# Patient Record
Sex: Male | Born: 1950 | Race: White | Hispanic: No | Marital: Single | Smoking: Former smoker
Health system: Southern US, Community
[De-identification: ages and names within clinical notes are randomized; demographics above are authoritative.]

## PROBLEM LIST (undated history)

## (undated) DIAGNOSIS — E785 Hyperlipidemia, unspecified: Secondary | ICD-10-CM

## (undated) DIAGNOSIS — K5120 Ulcerative (chronic) proctitis without complications: Secondary | ICD-10-CM

## (undated) DIAGNOSIS — I4891 Unspecified atrial fibrillation: Secondary | ICD-10-CM

## (undated) HISTORY — DX: Unspecified atrial fibrillation: I48.91

## (undated) HISTORY — PX: TOTAL HIP ARTHROPLASTY: SHX124

## (undated) HISTORY — DX: Ulcerative (chronic) proctitis without complications: K51.20

## (undated) HISTORY — DX: Hyperlipidemia, unspecified: E78.5

## (undated) HISTORY — PX: ATRIAL FIBRILLATION ABLATION: EP1191

---

## 2016-02-20 DIAGNOSIS — E785 Hyperlipidemia, unspecified: Secondary | ICD-10-CM | POA: Diagnosis present

## 2016-02-20 DIAGNOSIS — M1611 Unilateral primary osteoarthritis, right hip: Secondary | ICD-10-CM | POA: Diagnosis not present

## 2016-02-20 DIAGNOSIS — D62 Acute posthemorrhagic anemia: Secondary | ICD-10-CM | POA: Diagnosis not present

## 2016-02-20 DIAGNOSIS — Z7982 Long term (current) use of aspirin: Secondary | ICD-10-CM | POA: Diagnosis not present

## 2016-02-20 DIAGNOSIS — K5190 Ulcerative colitis, unspecified, without complications: Secondary | ICD-10-CM | POA: Diagnosis present

## 2016-03-14 DIAGNOSIS — R600 Localized edema: Secondary | ICD-10-CM | POA: Diagnosis not present

## 2016-03-14 DIAGNOSIS — M79671 Pain in right foot: Secondary | ICD-10-CM | POA: Diagnosis not present

## 2016-04-11 DIAGNOSIS — Z96641 Presence of right artificial hip joint: Secondary | ICD-10-CM | POA: Diagnosis not present

## 2016-04-11 DIAGNOSIS — Z471 Aftercare following joint replacement surgery: Secondary | ICD-10-CM | POA: Diagnosis not present

## 2016-04-11 DIAGNOSIS — S76011A Strain of muscle, fascia and tendon of right hip, initial encounter: Secondary | ICD-10-CM | POA: Diagnosis not present

## 2016-07-21 DIAGNOSIS — Y92009 Unspecified place in unspecified non-institutional (private) residence as the place of occurrence of the external cause: Secondary | ICD-10-CM | POA: Diagnosis not present

## 2016-07-21 DIAGNOSIS — X58XXXA Exposure to other specified factors, initial encounter: Secondary | ICD-10-CM | POA: Diagnosis not present

## 2016-07-21 DIAGNOSIS — S91211A Laceration without foreign body of right great toe with damage to nail, initial encounter: Secondary | ICD-10-CM | POA: Diagnosis not present

## 2017-01-17 DIAGNOSIS — L301 Dyshidrosis [pompholyx]: Secondary | ICD-10-CM | POA: Diagnosis not present

## 2017-02-21 DIAGNOSIS — L301 Dyshidrosis [pompholyx]: Secondary | ICD-10-CM | POA: Diagnosis not present

## 2017-04-04 DIAGNOSIS — L2082 Flexural eczema: Secondary | ICD-10-CM | POA: Diagnosis not present

## 2017-04-16 DIAGNOSIS — K1120 Sialoadenitis, unspecified: Secondary | ICD-10-CM | POA: Diagnosis not present

## 2017-04-16 DIAGNOSIS — J029 Acute pharyngitis, unspecified: Secondary | ICD-10-CM | POA: Diagnosis not present

## 2017-04-23 DIAGNOSIS — J069 Acute upper respiratory infection, unspecified: Secondary | ICD-10-CM | POA: Diagnosis not present

## 2017-05-08 DIAGNOSIS — K5120 Ulcerative (chronic) proctitis without complications: Secondary | ICD-10-CM | POA: Diagnosis not present

## 2017-05-08 DIAGNOSIS — R002 Palpitations: Secondary | ICD-10-CM | POA: Diagnosis not present

## 2017-05-08 DIAGNOSIS — E785 Hyperlipidemia, unspecified: Secondary | ICD-10-CM | POA: Diagnosis not present

## 2017-05-21 DIAGNOSIS — E785 Hyperlipidemia, unspecified: Secondary | ICD-10-CM | POA: Diagnosis not present

## 2017-05-21 DIAGNOSIS — I480 Paroxysmal atrial fibrillation: Secondary | ICD-10-CM | POA: Diagnosis not present

## 2017-05-21 DIAGNOSIS — R002 Palpitations: Secondary | ICD-10-CM | POA: Diagnosis not present

## 2017-05-22 DIAGNOSIS — K5120 Ulcerative (chronic) proctitis without complications: Secondary | ICD-10-CM | POA: Diagnosis not present

## 2017-06-11 DIAGNOSIS — Z23 Encounter for immunization: Secondary | ICD-10-CM | POA: Diagnosis not present

## 2017-06-11 DIAGNOSIS — Z0000 Encounter for general adult medical examination without abnormal findings: Secondary | ICD-10-CM | POA: Diagnosis not present

## 2017-06-11 DIAGNOSIS — Z125 Encounter for screening for malignant neoplasm of prostate: Secondary | ICD-10-CM | POA: Diagnosis not present

## 2017-06-11 DIAGNOSIS — K5120 Ulcerative (chronic) proctitis without complications: Secondary | ICD-10-CM | POA: Diagnosis not present

## 2017-06-11 DIAGNOSIS — H6123 Impacted cerumen, bilateral: Secondary | ICD-10-CM | POA: Diagnosis not present

## 2017-06-11 DIAGNOSIS — E785 Hyperlipidemia, unspecified: Secondary | ICD-10-CM | POA: Diagnosis not present

## 2017-06-11 DIAGNOSIS — Z1159 Encounter for screening for other viral diseases: Secondary | ICD-10-CM | POA: Diagnosis not present

## 2017-07-29 DIAGNOSIS — N528 Other male erectile dysfunction: Secondary | ICD-10-CM | POA: Diagnosis not present

## 2017-08-08 DIAGNOSIS — K640 First degree hemorrhoids: Secondary | ICD-10-CM | POA: Diagnosis not present

## 2017-08-08 DIAGNOSIS — D126 Benign neoplasm of colon, unspecified: Secondary | ICD-10-CM | POA: Diagnosis not present

## 2017-08-08 DIAGNOSIS — K621 Rectal polyp: Secondary | ICD-10-CM | POA: Diagnosis not present

## 2017-08-08 DIAGNOSIS — K5190 Ulcerative colitis, unspecified, without complications: Secondary | ICD-10-CM | POA: Diagnosis not present

## 2017-08-15 DIAGNOSIS — D126 Benign neoplasm of colon, unspecified: Secondary | ICD-10-CM | POA: Diagnosis not present

## 2017-08-15 DIAGNOSIS — K5190 Ulcerative colitis, unspecified, without complications: Secondary | ICD-10-CM | POA: Diagnosis not present

## 2017-08-15 DIAGNOSIS — K621 Rectal polyp: Secondary | ICD-10-CM | POA: Diagnosis not present

## 2017-11-19 DIAGNOSIS — Z961 Presence of intraocular lens: Secondary | ICD-10-CM | POA: Diagnosis not present

## 2017-11-19 DIAGNOSIS — H11829 Conjunctivochalasis, unspecified eye: Secondary | ICD-10-CM | POA: Diagnosis not present

## 2017-11-19 DIAGNOSIS — H4311 Vitreous hemorrhage, right eye: Secondary | ICD-10-CM | POA: Diagnosis not present

## 2017-11-19 DIAGNOSIS — H43393 Other vitreous opacities, bilateral: Secondary | ICD-10-CM | POA: Diagnosis not present

## 2017-11-19 DIAGNOSIS — H33311 Horseshoe tear of retina without detachment, right eye: Secondary | ICD-10-CM | POA: Diagnosis not present

## 2017-12-19 DIAGNOSIS — L579 Skin changes due to chronic exposure to nonionizing radiation, unspecified: Secondary | ICD-10-CM | POA: Diagnosis not present

## 2017-12-19 DIAGNOSIS — L301 Dyshidrosis [pompholyx]: Secondary | ICD-10-CM | POA: Diagnosis not present

## 2017-12-19 DIAGNOSIS — L2082 Flexural eczema: Secondary | ICD-10-CM | POA: Diagnosis not present

## 2018-01-27 DIAGNOSIS — Z09 Encounter for follow-up examination after completed treatment for conditions other than malignant neoplasm: Secondary | ICD-10-CM | POA: Diagnosis not present

## 2018-01-27 DIAGNOSIS — H4311 Vitreous hemorrhage, right eye: Secondary | ICD-10-CM | POA: Diagnosis not present

## 2018-01-27 DIAGNOSIS — H33311 Horseshoe tear of retina without detachment, right eye: Secondary | ICD-10-CM | POA: Diagnosis not present

## 2018-03-20 DIAGNOSIS — L579 Skin changes due to chronic exposure to nonionizing radiation, unspecified: Secondary | ICD-10-CM | POA: Diagnosis not present

## 2018-03-20 DIAGNOSIS — L2082 Flexural eczema: Secondary | ICD-10-CM | POA: Diagnosis not present

## 2018-03-20 DIAGNOSIS — L814 Other melanin hyperpigmentation: Secondary | ICD-10-CM | POA: Diagnosis not present

## 2018-06-16 DIAGNOSIS — Z23 Encounter for immunization: Secondary | ICD-10-CM | POA: Diagnosis not present

## 2018-06-16 DIAGNOSIS — Z131 Encounter for screening for diabetes mellitus: Secondary | ICD-10-CM | POA: Diagnosis not present

## 2018-06-16 DIAGNOSIS — Z125 Encounter for screening for malignant neoplasm of prostate: Secondary | ICD-10-CM | POA: Diagnosis not present

## 2018-06-16 DIAGNOSIS — Z0000 Encounter for general adult medical examination without abnormal findings: Secondary | ICD-10-CM | POA: Diagnosis not present

## 2018-06-16 DIAGNOSIS — E785 Hyperlipidemia, unspecified: Secondary | ICD-10-CM | POA: Diagnosis not present

## 2018-06-16 DIAGNOSIS — I480 Paroxysmal atrial fibrillation: Secondary | ICD-10-CM | POA: Diagnosis not present

## 2018-09-12 DIAGNOSIS — L579 Skin changes due to chronic exposure to nonionizing radiation, unspecified: Secondary | ICD-10-CM | POA: Diagnosis not present

## 2018-09-12 DIAGNOSIS — L814 Other melanin hyperpigmentation: Secondary | ICD-10-CM | POA: Diagnosis not present

## 2018-09-12 DIAGNOSIS — L2082 Flexural eczema: Secondary | ICD-10-CM | POA: Diagnosis not present

## 2018-09-12 DIAGNOSIS — Z79899 Other long term (current) drug therapy: Secondary | ICD-10-CM | POA: Diagnosis not present

## 2018-09-12 DIAGNOSIS — L853 Xerosis cutis: Secondary | ICD-10-CM | POA: Diagnosis not present

## 2018-09-12 DIAGNOSIS — L301 Dyshidrosis [pompholyx]: Secondary | ICD-10-CM | POA: Diagnosis not present

## 2018-10-23 DIAGNOSIS — Z79899 Other long term (current) drug therapy: Secondary | ICD-10-CM | POA: Diagnosis not present

## 2018-10-23 DIAGNOSIS — L579 Skin changes due to chronic exposure to nonionizing radiation, unspecified: Secondary | ICD-10-CM | POA: Diagnosis not present

## 2018-10-23 DIAGNOSIS — L2082 Flexural eczema: Secondary | ICD-10-CM | POA: Diagnosis not present

## 2018-11-12 DIAGNOSIS — K5120 Ulcerative (chronic) proctitis without complications: Secondary | ICD-10-CM | POA: Diagnosis not present

## 2018-11-12 DIAGNOSIS — R197 Diarrhea, unspecified: Secondary | ICD-10-CM | POA: Diagnosis not present

## 2018-11-27 ENCOUNTER — Telehealth: Payer: Self-pay | Admitting: Internal Medicine

## 2018-11-27 NOTE — Telephone Encounter (Signed)
Records received from [facility] Appt 12/03/18 @ 10:20AM. [place]

## 2018-12-02 ENCOUNTER — Telehealth: Payer: Self-pay | Admitting: Internal Medicine

## 2018-12-02 NOTE — Telephone Encounter (Signed)
LVM for pre reg

## 2018-12-02 NOTE — Telephone Encounter (Signed)
Smartphone/ my chart via text/ virtual consent /pre reg completed

## 2018-12-03 ENCOUNTER — Telehealth: Payer: Self-pay

## 2018-12-03 ENCOUNTER — Telehealth: Payer: Self-pay | Admitting: Radiology

## 2018-12-03 ENCOUNTER — Encounter: Payer: Self-pay | Admitting: Internal Medicine

## 2018-12-03 ENCOUNTER — Telehealth (INDEPENDENT_AMBULATORY_CARE_PROVIDER_SITE_OTHER): Payer: Medicare Other | Admitting: Internal Medicine

## 2018-12-03 VITALS — Ht 72.0 in | Wt 191.0 lb

## 2018-12-03 DIAGNOSIS — R002 Palpitations: Secondary | ICD-10-CM

## 2018-12-03 DIAGNOSIS — R0602 Shortness of breath: Secondary | ICD-10-CM

## 2018-12-03 DIAGNOSIS — E785 Hyperlipidemia, unspecified: Secondary | ICD-10-CM

## 2018-12-03 DIAGNOSIS — I4891 Unspecified atrial fibrillation: Principal | ICD-10-CM

## 2018-12-03 DIAGNOSIS — I480 Paroxysmal atrial fibrillation: Secondary | ICD-10-CM

## 2018-12-03 NOTE — Patient Instructions (Addendum)
Medication Instructions:   STOP GEMFIBROZIL  If you need a refill on your cardiac medications before your next appointment, please call your pharmacy.   Lab work:  NONE ordered at this time of appointment   If you have labs (blood work) drawn today and your tests are completely normal, you will receive your results only by: Marland Kitchen MyChart Message (if you have MyChart) OR . A paper copy in the mail If you have any lab test that is abnormal or we need to change your treatment, we will call you to review the results.  Testing/Procedures: Your physician has recommended that you wear a 3 DAY ZIO-PATCH monitor. The Zio patch cardiac monitor continuously records heart rhythm data for up to 14 days, this is for patients being evaluated for multiple types heart rhythms. For the first 24 hours post application, please avoid getting the Zio monitor wet in the shower or by excessive sweating during exercise. After that, feel free to carry on with regular activities. Keep soaps and lotions away from the ZIO XT Patch.  This will be placed at our [facility] location - [address], [address].         Follow-Up:  At [facility], you and your health needs are our priority.  As part of our continuing mission to provide you with exceptional heart care, we have created designated Provider Care Teams.  These Care Teams include your primary Cardiologist (physician) and Advanced Practice Providers (APPs -  Physician Assistants and Nurse Practitioners) who all work together to provide you with the care you need, when you need it. You will need a follow up appointment in 1 months.  Please call our office 2 months in advance to schedule this appointment.  You may see Elouise Munroe, MD or one of the following Advanced Practice Providers on your designated Care Team:   Rosaria Ferries, PA-C . Jory Sims, DNP, ANP  Any Other Special Instructions Will Be Listed Below (If Applicable).

## 2018-12-03 NOTE — Telephone Encounter (Signed)
Virtual Visit Pre-Appointment Phone Call  "(Name), I am calling you today to discuss your upcoming appointment. We are currently trying to limit exposure to the virus that causes COVID-19 by seeing patients at home rather than in the office."  1. "What is the BEST phone number to call the day of the visit?" - include this in appointment notes  2. "Do you have or have access to (through a family member/friend) a smartphone with video capability that we can use for your visit?" a. If yes - list this number in appt notes as "cell" (if different from BEST phone #) and list the appointment type as a VIDEO visit in appointment notes b. If no - list the appointment type as a PHONE visit in appointment notes  3. Confirm consent - "In the setting of the current Covid19 crisis, you are scheduled for a VIDEO visit with your provider on 12/03/2018 at 10:20AM.  Just as we do with many in-office visits, in order for you to participate in this visit, we must obtain consent.  If you'd like, I can send this to your mychart (if signed up) or email for you to review.  Otherwise, I can obtain your verbal consent now.  All virtual visits are billed to your insurance company just like a normal visit would be.  By agreeing to a virtual visit, we'd like you to understand that the technology does not allow for your provider to perform an examination, and thus may limit your provider's ability to fully assess your condition. If your provider identifies any concerns that need to be evaluated in person, we will make arrangements to do so.  Finally, though the technology is pretty good, we cannot assure that it will always work on either your or our end, and in the setting of a video visit, we may have to convert it to a phone-only visit.  In either situation, we cannot ensure that we have a secure connection.  Are you willing to proceed?" STAFF: Did the patient verbally acknowledge consent to telehealth visit? Document YES/NO  here: YES  4. Advise patient to be prepared - "Two hours prior to your appointment, go ahead and check your blood pressure, pulse, oxygen saturation, and your weight (if you have the equipment to check those) and write them all down. When your visit starts, your provider will ask you for this information. If you have an Apple Watch or Kardia device, please plan to have heart rate information ready on the day of your appointment. Please have a pen and paper handy nearby the day of the visit as well."  5. Give patient instructions for MyChart download to smartphone OR Doximity/Doxy.me as below if video visit (depending on what platform provider is using)  6. Inform patient they will receive a phone call 15 minutes prior to their appointment time (may be from unknown caller ID) so they should be prepared to answer    TELEPHONE CALL NOTE  Kent Bryan has been deemed a candidate for a follow-up tele-health visit to limit community exposure during the Covid-19 pandemic. I spoke with the patient via phone to ensure availability of phone/video source, confirm preferred email & phone number, and discuss instructions and expectations.  I reminded Kent Bryan to be prepared with any vital sign and/or heart rhythm information that could potentially be obtained via home monitoring, at the time of his visit. I reminded Kent Bryan to expect a phone call prior to his visit.  Jacqulynn Cadet,  CMA 12/03/2018 10:40 AM   INSTRUCTIONS FOR DOWNLOADING THE MYCHART APP TO SMARTPHONE  - The patient must first make sure to have activated MyChart and know their login information - If Apple, go to [employer] and type in MyChart in the search bar and download the app. If Android, ask patient to go to [employer] and type in [place]ola in the search bar and download the app. The app is free but as with any other app downloads, their phone may require them to verify saved payment information or Apple/Android password.  -  The patient will need to then log into the app with their MyChart username and password, and select [facility] as their healthcare provider to link the account. When it is time for your visit, go to the MyChart app, find appointments, and click Begin Video Visit. Be sure to Select Allow for your device to access the Microphone and Camera for your visit. You will then be connected, and your provider will be with you shortly.  **If they have any issues connecting, or need assistance please contact MyChart service desk [phone])**  **If using a computer, in order to ensure the best quality for their visit they will need to use either of the following Internet Browsers: [employer], or Google Chrome**  IF USING DOXIMITY or DOXY.ME - The patient will receive a link just prior to their visit by text.     FULL LENGTH CONSENT FOR TELE-HEALTH VISIT   I hereby voluntarily request, consent and authorize [facility] and its employed or contracted physicians, physician assistants, nurse practitioners or other licensed health care professionals (the Practitioner), to provide me with telemedicine health care services (the "Services") as deemed necessary by the treating Practitioner. I acknowledge and consent to receive the Services by the Practitioner via telemedicine. I understand that the telemedicine visit will involve communicating with the Practitioner through live audiovisual communication technology and the disclosure of certain medical information by electronic transmission. I acknowledge that I have been given the opportunity to request an in-person assessment or other available alternative prior to the telemedicine visit and am voluntarily participating in the telemedicine visit.  I understand that I have the right to withhold or withdraw my consent to the use of telemedicine in the course of my care at any time, without affecting my right to future care or treatment, and that the  Practitioner or I may terminate the telemedicine visit at any time. I understand that I have the right to inspect all information obtained and/or recorded in the course of the telemedicine visit and may receive copies of available information for a reasonable fee.  I understand that some of the potential risks of receiving the Services via telemedicine include:  Marland Kitchen Delay or interruption in medical evaluation due to technological equipment failure or disruption; . Information transmitted may not be sufficient (e.g. poor resolution of images) to allow for appropriate medical decision making by the Practitioner; and/or  . In rare instances, security protocols could fail, causing a breach of personal health information.  Furthermore, I acknowledge that it is my responsibility to provide information about my medical history, conditions and care that is complete and accurate to the best of my ability. I acknowledge that Practitioner's advice, recommendations, and/or decision may be based on factors not within their control, such as incomplete or inaccurate data provided by me or distortions of diagnostic images or specimens that may result from electronic transmissions. I understand that the practice of  medicine is not an Chief Strategy Officer and that Practitioner makes no warranties or guarantees regarding treatment outcomes. I acknowledge that I will receive a copy of this consent concurrently upon execution via email to the email address I last provided but may also request a printed copy by calling the office of [facility].    I understand that my insurance will be billed for this visit.   I have read or had this consent read to me. . I understand the contents of this consent, which adequately explains the benefits and risks of the Services being provided via telemedicine.  . I have been provided ample opportunity to ask questions regarding this consent and the Services and have had my questions answered to my  satisfaction. . I give my informed consent for the services to be provided through the use of telemedicine in my medical care  By participating in this telemedicine visit I agree to the above.

## 2018-12-03 NOTE — Progress Notes (Signed)
Virtual Visit via Video Note   This visit type was conducted due to national recommendations for restrictions regarding the COVID-19 Pandemic (e.g. social distancing) in an effort to limit this patient's exposure and mitigate transmission in our community.  Due to his co-morbid illnesses, this patient is at least at moderate risk for complications without adequate follow up.  This format is felt to be most appropriate for this patient at this time.  All issues noted in this document were discussed and addressed.  A limited physical exam was performed with this format.  Please refer to the patient's chart for his consent to telehealth for [facility].   Evaluation Performed:  Follow-up visit  Date:  12/03/2018   ID:  Kent Bryan, DOB 1950/11/13, MRN [id number]  Patient Location: Home Provider Location: Home  PCP:  Kristen Loader, FNP  Cardiologist:  Elouise Munroe, MD  Electrophysiologist:  None   Chief Complaint:  Follow up atrial fibrillation  History of Present Illness:    Kent Bryan is a 68 y.o. male with paroxysmal atrial fibrillation and hyperlipidemia who presents today for follow up of afib.   He was previously seen by Dr. Tollie Eth on May 21, 2017. Patient notes that in 2004 he was diagnosed with afib, and in 2006-2007, he had 3 ablations with recurrence of atrial fibrillation. He was treated at the [facility] by Dr. Melene Muller. He try AAD but was unable to maintain sinus rhythm.   He visited the [facility] in 2014 and had an additional ablation. He has been maintaining sinus rhythm since that time. He took anticoagulation at the time of his ablation, but has not since.   However, 2-3 week ago he began to notice a sense of 2-3 second bursts of a fluttering feeling. It is fleeting but will occur throughout the day. He is concerned he may be back in atrial fibrillation.   He notes his symptoms correlate as follows: when in afib, he feels a fast  irregular heart rhythm, and when he feels he is in atrial flutter, he feels short of breath but a normal heart rate. He has a sense of shortness of breath now, primarily with activity.  The patient denies chest pain, chest pressure, PND, orthopnea, or leg swelling. Denies syncope or presyncope. Denies dizziness or lightheadedness.   The patient does not have symptoms concerning for COVID-19 infection (fever, chills, cough, or new shortness of breath).    Past Medical History:  Diagnosis Date  . Atrial fibrillation ([place])   . Hyperlipidemia   . Ulcerative proctitis [facility])    Past Surgical History:  Procedure Laterality Date  . ATRIAL FIBRILLATION ABLATION    . TOTAL HIP ARTHROPLASTY Bilateral      Current Meds  Medication Sig  . B Complex Vitamins (B COMPLEX 1 PO) B Complex  1 qd  . folic acid (FOLVITE) 1 MG tablet Take 1 mg by mouth daily.  . mesalamine (LIALDA) 1.2 g EC tablet Take 1.2 g by mouth 2 (two) times a day.   . methotrexate 2.5 MG tablet Take 12.5 mg by mouth once a week. Caution: Chemotherapy. Protect from light.   . Multiple Vitamin (MULTI-VITAMIN DAILY PO) Take 1 tablet by mouth 2 (two) times a day.   . simvastatin (ZOCOR) 20 MG tablet Take 20 mg by mouth daily at 6 PM.   . [DISCONTINUED] gemfibrozil (LOPID) 600 MG tablet Take 600 mg by mouth 2 (two) times a day.  Allergies:   Patient has no known allergies.   Social History   Tobacco Use  . Smoking status: Former Research scientist (life sciences)  . Smokeless tobacco: Never Used  Substance Use Topics  . Alcohol use: Not on file  . Drug use: Not on file     Family Hx: The patient's family history includes Heart failure in his mother. No family history of sudden death.  ROS:   Please see the history of present illness.     All other systems reviewed and are negative.   Prior CV studies:   The following studies were reviewed today:    Labs/Other Tests and Data Reviewed:    EKG:  No ECG reviewed.  Recent Labs: No  results found for requested labs within last 8760 hours.   Recent Lipid Panel No results found for: CHOL, TRIG, HDL, CHOLHDL, LDLCALC, LDLDIRECT  Wt Readings from Last 3 Encounters:  12/26/18 190 lb (86.2 kg)  12/03/18 191 lb (86.6 kg)     Objective:    Vital Signs:  Ht 6' (1.829 m)   Wt 191 lb (86.6 kg)   BMI 25.90 kg/m    VITAL SIGNS:  reviewed GEN:  no acute distress EYES:  sclerae anicteric, EOMI - Extraocular Movements Intact RESPIRATORY:  normal respiratory effort, symmetric expansion CARDIOVASCULAR:  no peripheral edema SKIN:  no rash, lesions or ulcers. MUSCULOSKELETAL:  no obvious deformities. NEURO:  alert and oriented x 3, no obvious focal deficit PSYCH:  normal affect  ASSESSMENT & PLAN:    1. Palpitations   2. Paroxysmal atrial fibrillation (HCC)   3. Hyperlipidemia, unspecified hyperlipidemia type   4. Shortness of breath    Palpitations/PAF - we will screen with a Zio patch to ensure no recurrence of Afib.   Hyperlipidemia- controlled per patient, discontinued fibrate in setting of current statin use, will reassess lipids in follow up.   Shortness of breath - will monitor, may be related to rhythm/afib. Will reassess if no afib, consider possibility of pulmonary vein stenosis with hx of repeated ablations.  COVID-19 Education: The signs and symptoms of COVID-19 were discussed with the patient and how to seek care for testing (follow up with PCP or arrange E-visit).  The importance of social distancing was discussed today.  Time:   Today, I have spent 35 minutes with the patient with telehealth technology discussing the above problems.     Medication Adjustments/Labs and Tests Ordered: Current medicines are reviewed at length with the patient today.  Concerns regarding medicines are outlined above.   Tests Ordered: Orders Placed This Encounter  Procedures  . LONG TERM MONITOR (3-14 DAYS)    Medication Changes: No orders of the defined types were  placed in this encounter.   Disposition:  Follow up in 1 month(s)  Signed, Elouise Munroe, MD  12/03/2018 10:58 AM    [facility]  Medication Instructions:   STOP GEMFIBROZIL  If you need a refill on your cardiac medications before your next appointment, please call your pharmacy.   Lab work:  NONE ordered at this time of appointment   If you have labs (blood work) drawn today and your tests are completely normal, you will receive your results only by: Marland Kitchen MyChart Message (if you have MyChart) OR . A paper copy in the mail If you have any lab test that is abnormal or we need to change your treatment, we will call you to review the results.  Testing/Procedures: Your physician has recommended that  you wear a 3 DAY ZIO-PATCH monitor. The Zio patch cardiac monitor continuously records heart rhythm data for up to 14 days, this is for patients being evaluated for multiple types heart rhythms. For the first 24 hours post application, please avoid getting the Zio monitor wet in the shower or by excessive sweating during exercise. After that, feel free to carry on with regular activities. Keep soaps and lotions away from the ZIO XT Patch.  This will be placed at our [facility] location - [address], [address].         Follow-Up:  At [facility], you and your health needs are our priority.  As part of our continuing mission to provide you with exceptional heart care, we have created designated Provider Care Teams.  These Care Teams include your primary Cardiologist (physician) and Advanced Practice Providers (APPs -  Physician Assistants and Nurse Practitioners) who all work together to provide you with the care you need, when you need it. You will need a follow up appointment in 1 months.  Please call our office 2 months in advance to schedule this appointment.  You may see Elouise Munroe, MD or one of the following Advanced Practice Providers on your  designated Care Team:   Rosaria Ferries, PA-C . Jory Sims, DNP, ANP  Any Other Special Instructions Will Be Listed Below (If Applicable).

## 2018-12-03 NOTE — Telephone Encounter (Signed)
Enrolled patient for a 3 Day Zio Long Term Monitor to be mailed due to Covid-19. I went over instructions with patients and he knows to expect the monitor to arrive in 3-4 days.

## 2018-12-08 ENCOUNTER — Ambulatory Visit (INDEPENDENT_AMBULATORY_CARE_PROVIDER_SITE_OTHER): Payer: Medicare Other

## 2018-12-08 DIAGNOSIS — I4891 Unspecified atrial fibrillation: Secondary | ICD-10-CM | POA: Diagnosis not present

## 2018-12-18 ENCOUNTER — Other Ambulatory Visit: Payer: Self-pay

## 2018-12-18 DIAGNOSIS — I4891 Unspecified atrial fibrillation: Secondary | ICD-10-CM | POA: Diagnosis not present

## 2018-12-22 DIAGNOSIS — L579 Skin changes due to chronic exposure to nonionizing radiation, unspecified: Secondary | ICD-10-CM | POA: Diagnosis not present

## 2018-12-22 DIAGNOSIS — L2082 Flexural eczema: Secondary | ICD-10-CM | POA: Diagnosis not present

## 2018-12-22 DIAGNOSIS — L814 Other melanin hyperpigmentation: Secondary | ICD-10-CM | POA: Diagnosis not present

## 2018-12-24 ENCOUNTER — Telehealth: Payer: Self-pay | Admitting: Internal Medicine

## 2018-12-25 NOTE — Telephone Encounter (Signed)
VERBAL CONSENT WAS GIVEN TO Kent Bryan ON 12/24/2018.      Virtual Visit Pre-Appointment Phone Call  "Kent Bryan, I am calling you today to discuss your upcoming appointment. We are currently trying to limit exposure to the virus that causes COVID-19 by seeing patients at home rather than in the office."  1. "What is the BEST phone number to call the day of the visit?" - include this in appointment notes  2. "Do you have or have access to (through a family member/friend) a smartphone with video capability that we can use for your visit?" a. If yes - list this number in appt notes as "cell" (if different from BEST phone #) and list the appointment type as a VIDEO visit in appointment notes b. If no - list the appointment type as a PHONE visit in appointment notes  3. Confirm consent - "In the setting of the current Covid19 crisis, you are scheduled for a VIDEO visit with your provider on 12/26/2018 at 11:20am.  Just as we do with many in-office visits, in order for you to participate in this visit, we must obtain consent.  If you'd like, I can send this to your mychart (if signed up) or email for you to review.  Otherwise, I can obtain your verbal consent now.  All virtual visits are billed to your insurance company just like a normal visit would be.  By agreeing to a virtual visit, we'd like you to understand that the technology does not allow for your provider to perform an examination, and thus may limit your provider's ability to fully assess your condition. If your provider identifies any concerns that need to be evaluated in person, we will make arrangements to do so.  Finally, though the technology is pretty good, we cannot assure that it will always work on either your or our end, and in the setting of a video visit, we may have to convert it to a phone-only visit.  In either situation, we cannot ensure that we have a secure connection.  Are you willing to proceed?" STAFF: Did the patient  verbally acknowledge consent to telehealth visit? Document YES/NO here: YES  4. Advise patient to be prepared - "Two hours prior to your appointment, go ahead and check your blood pressure, pulse, oxygen saturation, and your weight (if you have the equipment to check those) and write them all down. When your visit starts, your provider will ask you for this information. If you have an Apple Watch or Kardia device, please plan to have heart rate information ready on the day of your appointment. Please have a pen and paper handy nearby the day of the visit as well."  5. Give patient instructions for MyChart download to smartphone OR Doximity/Doxy.me as below if video visit (depending on what platform provider is using)  6. Inform patient they will receive a phone call 15 minutes prior to their appointment time (may be from unknown caller ID) so they should be prepared to answer    TELEPHONE CALL NOTE  Kent Bryan has been deemed a candidate for a follow-up tele-health visit to limit community exposure during the Covid-19 pandemic. I spoke with the patient via phone to ensure availability of phone/video source, confirm preferred email & phone number, and discuss instructions and expectations.  I reminded Kent Bryan to be prepared with any vital sign and/or heart rhythm information that could potentially be obtained via home monitoring, at the time of his visit. I reminded Kent Bryan to expect a phone call prior to his visit.  Kent Bryan, [place] 12/25/2018 5:48 PM   INSTRUCTIONS FOR DOWNLOADING THE MYCHART APP TO SMARTPHONE  - The patient must first make sure to have activated MyChart and know their login information - If Apple, go to [employer] and type in MyChart in the search bar and download the app. If Android, ask patient to go to [employer] and type in Olivia in the search bar and download the app. The app is free but as with any other app downloads, their phone may require them  to verify saved payment information or Apple/Android password.  - The patient will need to then log into the app with their MyChart username and password, and select [facility] as their healthcare provider to link the account. When it is time for your visit, go to the MyChart app, find appointments, and click Begin Video Visit. Be sure to Select Allow for your device to access the Microphone and Camera for your visit. You will then be connected, and your provider will be with you shortly.  **If they have any issues connecting, or need assistance please contact MyChart service desk [phone])**  **If using a computer, in order to ensure the best quality for their visit they will need to use either of the following Internet Browsers: [employer], or Google Chrome**  IF USING DOXIMITY or DOXY.ME - The patient will receive a link just prior to their visit by text.     FULL LENGTH CONSENT FOR TELE-HEALTH VISIT   I hereby voluntarily request, consent and authorize [facility] and its employed or contracted physicians, physician assistants, nurse practitioners or other licensed health care professionals (the Practitioner), to provide me with telemedicine health care services (the "Services") as deemed necessary by the treating Practitioner. I acknowledge and consent to receive the Services by the Practitioner via telemedicine. I understand that the telemedicine visit will involve communicating with the Practitioner through live audiovisual communication technology and the disclosure of certain medical information by electronic transmission. I acknowledge that I have been given the opportunity to request an in-person assessment or other available alternative prior to the telemedicine visit and am voluntarily participating in the telemedicine visit.  I understand that I have the right to withhold or withdraw my consent to the use of telemedicine in the course of my care at any time,  without affecting my right to future care or treatment, and that the Practitioner or I may terminate the telemedicine visit at any time. I understand that I have the right to inspect all information obtained and/or recorded in the course of the telemedicine visit and may receive copies of available information for a reasonable fee.  I understand that some of the potential risks of receiving the Services via telemedicine include:  Marland Kitchen Delay or interruption in medical evaluation due to technological equipment failure or disruption; . Information transmitted may not be sufficient (e.g. poor resolution of images) to allow for appropriate medical decision making by the Practitioner; and/or  . In rare instances, security protocols could fail, causing a breach of personal health information.  Furthermore, I acknowledge that it is my responsibility to provide information about my medical history, conditions and care that is complete and accurate to the best of my ability. I acknowledge that Practitioner's advice, recommendations, and/or decision may be based on factors not within their control, such as incomplete or inaccurate data provided by me or distortions of diagnostic images  or specimens that may result from electronic transmissions. I understand that the practice of medicine is not an exact science and that Practitioner makes no warranties or guarantees regarding treatment outcomes. I acknowledge that I will receive a copy of this consent concurrently upon execution via email to the email address I last provided but may also request a printed copy by calling the office of [facility].    I understand that my insurance will be billed for this visit.   I have read or had this consent read to me. . I understand the contents of this consent, which adequately explains the benefits and risks of the Services being provided via telemedicine.  . I have been provided ample opportunity to ask questions regarding  this consent and the Services and have had my questions answered to my satisfaction. . I give my informed consent for the services to be provided through the use of telemedicine in my medical care  By participating in this telemedicine visit I agree to the above.

## 2018-12-26 ENCOUNTER — Encounter: Payer: Self-pay | Admitting: Internal Medicine

## 2018-12-26 ENCOUNTER — Telehealth (INDEPENDENT_AMBULATORY_CARE_PROVIDER_SITE_OTHER): Payer: Medicare Other | Admitting: Internal Medicine

## 2018-12-26 ENCOUNTER — Telehealth: Payer: Self-pay

## 2018-12-26 VITALS — Ht 73.0 in | Wt 190.0 lb

## 2018-12-26 DIAGNOSIS — R0602 Shortness of breath: Secondary | ICD-10-CM | POA: Diagnosis not present

## 2018-12-26 DIAGNOSIS — E785 Hyperlipidemia, unspecified: Secondary | ICD-10-CM

## 2018-12-26 DIAGNOSIS — I480 Paroxysmal atrial fibrillation: Secondary | ICD-10-CM

## 2018-12-26 DIAGNOSIS — R002 Palpitations: Secondary | ICD-10-CM

## 2018-12-26 NOTE — Progress Notes (Signed)
Virtual Visit via Video Note   This visit type was conducted due to national recommendations for restrictions regarding the COVID-19 Pandemic (e.g. social distancing) in an effort to limit this patient's exposure and mitigate transmission in our community.  Due to his co-morbid illnesses, this patient is at least at moderate risk for complications without adequate follow up.  This format is felt to be most appropriate for this patient at this time.  All issues noted in this document were discussed and addressed.  A limited physical exam was performed with this format.  Please refer to the patient's chart for his consent to telehealth for [facility].   Date:  12/26/2018   ID:  Kent Bryan, DOB 18-Oct-1950, MRN [id number]  Patient Location: Home Provider Location: Home  PCP:  Kristen Loader, FNP  Cardiologist:  Elouise Munroe, MD  Electrophysiologist:  None   Evaluation Performed:  Follow-Up Visit  Chief Complaint:  F/u palpitations  History of Present Illness:    Kent Bryan is a 68 y.o. male with paroxysmal atrial fibrillation and hyperlipidemia who presents today for follow up of palpitations and shortness of breath.  We reviewed results of Zio patch, which demonstrated episodes of brief SVT without evidence of afib or atrial flutter. He feels his palpitations have improved, and are less frequent. He does get a sense of a "double tap" in a his chest that will occur for less than an hour, that will now happen once every 3 days. He does remain short of breath, and is primarily concerned about this. No recent cross sectional imaging available. No previous known diagnosis of pulmonary vein stenosis.   The patient denies chest pain, chest pressure, PND, orthopnea, or leg swelling. Denies syncope or presyncope. Denies dizziness or lightheadedness. Denies snoring and has not been evaluated for sleep apnea.  The patient does not have symptoms concerning for COVID-19 infection (fever,  chills, cough, or new shortness of breath).    Past Medical History:  Diagnosis Date  . Atrial fibrillation ([place])   . Hyperlipidemia   . Ulcerative proctitis [facility])    Past Surgical History:  Procedure Laterality Date  . ATRIAL FIBRILLATION ABLATION    . TOTAL HIP ARTHROPLASTY Bilateral      Current Meds  Medication Sig  . B Complex Vitamins (B COMPLEX 1 PO) B Complex  1 qd  . folic acid (FOLVITE) 1 MG tablet Take 1 mg by mouth daily.  . mesalamine (LIALDA) 1.2 g EC tablet Take 1.2 g by mouth 2 (two) times a day.   . methotrexate 2.5 MG tablet Take 12.5 mg by mouth once a week. Caution: Chemotherapy. Protect from light.   . Multiple Vitamin (MULTI-VITAMIN DAILY PO) Take 1 tablet by mouth 2 (two) times a day.   . simvastatin (ZOCOR) 20 MG tablet Take 20 mg by mouth daily at 6 PM.      Allergies:   Patient has no known allergies.   Social History   Tobacco Use  . Smoking status: Former Research scientist (life sciences)  . Smokeless tobacco: Never Used  Substance Use Topics  . Alcohol use: Not on file  . Drug use: Not on file     Family Hx: The patient's family history includes Heart failure in his mother.  ROS:   Please see the history of present illness.     All other systems reviewed and are negative.   Prior CV studies:   The following studies were reviewed today:  Zio results  Labs/Other Tests  and Data Reviewed:    EKG:  Zio monitor results reviewed demonstrating SVT  Recent Labs: No results found for requested labs within last 8760 hours.   Recent Lipid Panel No results found for: CHOL, TRIG, HDL, CHOLHDL, LDLCALC, LDLDIRECT  Wt Readings from Last 3 Encounters:  12/26/18 190 lb (86.2 kg)  12/03/18 191 lb (86.6 kg)     Objective:    Vital Signs:  Ht 6' 1"  (1.854 m)   Wt 190 lb (86.2 kg)   BMI 25.07 kg/m    VITAL SIGNS:  reviewed GEN:  no acute distress EYES:  sclerae anicteric, EOMI - Extraocular Movements Intact RESPIRATORY:  normal respiratory effort, symmetric  expansion CARDIOVASCULAR:  no peripheral edema SKIN:  no rash, lesions or ulcers. MUSCULOSKELETAL:  no obvious deformities. NEURO:  alert and oriented x 3, no obvious focal deficit PSYCH:  normal affect  ASSESSMENT & PLAN:    1. Shortness of breath   2. Paroxysmal atrial fibrillation (HCC)   3. Hyperlipidemia, unspecified hyperlipidemia type   4. Palpitations    For shortness of breath - we will obtain an echocardiogram to evaluate diastolic function, and if possible interrogation of pulmonary vein for pulmonary vein stenosis. *Addendum - I have also contacted the patient to schedule a CTA pulmonary vein study to evaluate for pulmonary vein stenosis.   Palpitations - likely represent SVT. I have offered hte patient an extended 30 day cardiac monitor if symptoms worsen or persist in a worrisome manner.  COVID-19 Education: The signs and symptoms of COVID-19 were discussed with the patient and how to seek care for testing (follow up with PCP or arrange E-visit).  The importance of social distancing was discussed today.  Time:   Today, I have spent 16 minutes with the patient with telehealth technology discussing the above problems.     Medication Adjustments/Labs and Tests Ordered: Current medicines are reviewed at length with the patient today.  Concerns regarding medicines are outlined above.   Tests Ordered: Orders Placed This Encounter  Procedures  . ECHOCARDIOGRAM COMPLETE    Medication Changes: No orders of the defined types were placed in this encounter.   Disposition:  Follow up in 1 month(s)  Signed, Elouise Munroe, MD  12/26/2018 11:09 AM     [facility]  Medication Instructions:  Your physician recommends that you continue on your current medications as directed. Please refer to the Current Medication list given to you today. If you need a refill on your cardiac medications before your next appointment, please call your pharmacy.    Lab work: NONE  If you have labs (blood work) drawn today and your tests are completely normal, you will receive your results only by: Marland Kitchen MyChart Message (if you have MyChart) OR . A paper copy in the mail If you have any lab test that is abnormal or we need to change your treatment, we will call you to review the results.  Testing/Procedures: Your physician has requested that you have an echocardiogram. Echocardiography is a painless test that uses sound waves to create images of your heart. It provides your doctor with information about the size and shape of your heart and how well your heart's chambers and valves are working. This procedure takes approximately one hour. There are no restrictions for this procedure. THIS TEST WILL BE COMPLETED AT AT [place] [address]  Follow-Up: At [facility], you and your health needs are our priority.  As part of our  continuing mission to provide you with exceptional heart care, we have created designated Provider Care Teams.  These Care Teams include your primary Cardiologist (physician) and Advanced Practice Providers (APPs -  Physician Assistants and Nurse Practitioners) who all work together to provide you with the care you need, when you need it. You will need a follow up appointment in 1 months.  You may see Elouise Munroe, MD or one of the following Advanced Practice Providers on your designated Care Team:   Rosaria Ferries, PA-C . Jory Sims, DNP, ANP  Any Other Special Instructions Will Be Listed Below (If Applicable).

## 2018-12-26 NOTE — Telephone Encounter (Signed)
Left message for patient to contact office to get virtual visit started.

## 2018-12-26 NOTE — Patient Instructions (Addendum)
Medication Instructions:  Your physician recommends that you continue on your current medications as directed. Please refer to the Current Medication list given to you today. If you need a refill on your cardiac medications before your next appointment, please call your pharmacy.   Lab work: NONE  If you have labs (blood work) drawn today and your tests are completely normal, you will receive your results only by: Marland Kitchen MyChart Message (if you have MyChart) OR . A paper copy in the mail If you have any lab test that is abnormal or we need to change your treatment, we will call you to review the results.  Testing/Procedures: Your physician has requested that you have an echocardiogram. Echocardiography is a painless test that uses sound waves to create images of your heart. It provides your doctor with information about the size and shape of your heart and how well your heart's chambers and valves are working. This procedure takes approximately one hour. There are no restrictions for this procedure. THIS TEST WILL BE COMPLETED AT AT [place] [address]  Follow-Up: At [facility], you and your health needs are our priority.  As part of our continuing mission to provide you with exceptional heart care, we have created designated Provider Care Teams.  These Care Teams include your primary Cardiologist (physician) and Advanced Practice Providers (APPs -  Physician Assistants and Nurse Practitioners) who all work together to provide you with the care you need, when you need it. You will need a follow up appointment in 1 months.  You may see Elouise Munroe, MD or one of the following Advanced Practice Providers on your designated Care Team:   Rosaria Ferries, PA-C . Jory Sims, DNP, ANP  Any Other Special Instructions Will Be Listed Below (If Applicable).

## 2018-12-26 NOTE — Telephone Encounter (Signed)
Follow up   Patient is ready to start virtual visit. Please call.

## 2018-12-26 NOTE — Telephone Encounter (Signed)
Left detailed message on patients machine informing patient of Dr Delphina Cahill recommendations (testing and follow up appt). Advised patient to contact the office with any questions or concerns.

## 2018-12-31 DIAGNOSIS — I480 Paroxysmal atrial fibrillation: Secondary | ICD-10-CM

## 2018-12-31 DIAGNOSIS — Q211 Atrial septal defect, unspecified: Secondary | ICD-10-CM

## 2018-12-31 DIAGNOSIS — R0602 Shortness of breath: Secondary | ICD-10-CM

## 2019-01-01 NOTE — Telephone Encounter (Signed)
Spoke with patient about AVS instructions. Patient voiced understanding and agreeable with Dr Delphina Cahill recommendations.

## 2019-01-02 NOTE — Telephone Encounter (Signed)
ORDERED PLACED FOR  CT OF PULM VEIN W AND W/O CONTRAST

## 2019-01-04 DIAGNOSIS — R072 Precordial pain: Secondary | ICD-10-CM | POA: Diagnosis not present

## 2019-01-04 DIAGNOSIS — Z79899 Other long term (current) drug therapy: Secondary | ICD-10-CM | POA: Diagnosis not present

## 2019-01-04 DIAGNOSIS — Z9889 Other specified postprocedural states: Secondary | ICD-10-CM | POA: Diagnosis not present

## 2019-01-04 DIAGNOSIS — R0602 Shortness of breath: Secondary | ICD-10-CM | POA: Diagnosis not present

## 2019-01-04 DIAGNOSIS — I2510 Atherosclerotic heart disease of native coronary artery without angina pectoris: Secondary | ICD-10-CM | POA: Diagnosis not present

## 2019-01-04 DIAGNOSIS — I4891 Unspecified atrial fibrillation: Secondary | ICD-10-CM | POA: Diagnosis not present

## 2019-01-05 DIAGNOSIS — R079 Chest pain, unspecified: Secondary | ICD-10-CM | POA: Diagnosis not present

## 2019-01-05 DIAGNOSIS — R0602 Shortness of breath: Secondary | ICD-10-CM | POA: Diagnosis not present

## 2019-01-05 DIAGNOSIS — R072 Precordial pain: Secondary | ICD-10-CM | POA: Diagnosis not present

## 2019-01-06 ENCOUNTER — Telehealth: Payer: Medicare Other | Admitting: Internal Medicine

## 2019-01-06 ENCOUNTER — Telehealth: Payer: Self-pay | Admitting: *Deleted

## 2019-01-06 NOTE — Telephone Encounter (Signed)
INFORMED PATIENT ECHO HAS BEEN CANCELLED. INFORMATION CAN BE LOCATED - CARE EVERYWHERE. PATIENT VERBALIZED UNDERSTANDING.

## 2019-01-06 NOTE — Telephone Encounter (Signed)
-----   Message from Elouise Munroe, MD sent at 01/06/2019  9:49 AM EDT ----- Regarding: cancel echo Please cancel patient's echo on 5/28, he just had one at [facility] yesterday.  Thanks, GA

## 2019-01-08 ENCOUNTER — Other Ambulatory Visit (HOSPITAL_COMMUNITY): Payer: Medicare Other

## 2019-01-23 ENCOUNTER — Telehealth (HOSPITAL_COMMUNITY): Payer: Self-pay | Admitting: Emergency Medicine

## 2019-01-23 ENCOUNTER — Other Ambulatory Visit (HOSPITAL_COMMUNITY): Payer: Self-pay | Admitting: Emergency Medicine

## 2019-01-23 DIAGNOSIS — I480 Paroxysmal atrial fibrillation: Secondary | ICD-10-CM

## 2019-01-23 NOTE — Telephone Encounter (Signed)
Left message on voicemail with name and callback number Kyelle Urbas RN Navigator Cardiac Imaging [facility] [phone] Office [phone] Cell  

## 2019-01-26 ENCOUNTER — Other Ambulatory Visit: Payer: Self-pay

## 2019-01-26 ENCOUNTER — Ambulatory Visit (HOSPITAL_COMMUNITY): Payer: Medicare Other

## 2019-01-26 ENCOUNTER — Ambulatory Visit (HOSPITAL_COMMUNITY)
Admission: RE | Admit: 2019-01-26 | Discharge: 2019-01-26 | Disposition: A | Discharge status: Home / Self Care | Payer: Medicare Other | Source: Ambulatory Visit | Attending: Internal Medicine | Admitting: Internal Medicine

## 2019-01-26 DIAGNOSIS — R0602 Shortness of breath: Secondary | ICD-10-CM

## 2019-01-26 DIAGNOSIS — Q211 Atrial septal defect, unspecified: Secondary | ICD-10-CM

## 2019-01-26 DIAGNOSIS — I480 Paroxysmal atrial fibrillation: Secondary | ICD-10-CM | POA: Insufficient documentation

## 2019-01-26 LAB — POCT I-STAT CREATININE: Creatinine, Ser: 0.9 mg/dL (ref 0.61–1.24)

## 2019-01-26 MED ORDER — METOPROLOL TARTRATE 5 MG/5ML IV SOLN
5.0000 mg | INTRAVENOUS | Status: DC | PRN
  Filled 2019-01-26: qty 5

## 2019-01-26 MED ORDER — IOHEXOL 350 MG/ML SOLN
90.0000 mL | Freq: Once | INTRAVENOUS | Status: AC | PRN
  Administered 2019-01-26: 16:00:00 90 mL via INTRAVENOUS

## 2019-01-26 MED ORDER — NITROGLYCERIN 0.4 MG SL SUBL
0.8000 mg | SUBLINGUAL_TABLET | SUBLINGUAL | Status: DC | PRN
  Filled 2019-01-26: qty 25

## 2019-01-26 MED ORDER — NITROGLYCERIN 0.4 MG SL SUBL
SUBLINGUAL_TABLET | SUBLINGUAL | Status: AC
  Filled 2019-01-26: qty 2

## 2019-01-28 ENCOUNTER — Telehealth: Payer: Self-pay | Admitting: *Deleted

## 2019-01-28 ENCOUNTER — Encounter: Payer: Self-pay | Admitting: Internal Medicine

## 2019-01-28 ENCOUNTER — Telehealth (INDEPENDENT_AMBULATORY_CARE_PROVIDER_SITE_OTHER): Payer: Medicare Other | Admitting: Internal Medicine

## 2019-01-28 VITALS — Ht 73.0 in | Wt 192.0 lb

## 2019-01-28 DIAGNOSIS — R0602 Shortness of breath: Secondary | ICD-10-CM | POA: Diagnosis not present

## 2019-01-28 DIAGNOSIS — R002 Palpitations: Secondary | ICD-10-CM

## 2019-01-28 DIAGNOSIS — E785 Hyperlipidemia, unspecified: Secondary | ICD-10-CM | POA: Diagnosis not present

## 2019-01-28 DIAGNOSIS — I480 Paroxysmal atrial fibrillation: Secondary | ICD-10-CM | POA: Diagnosis not present

## 2019-01-28 NOTE — Patient Instructions (Addendum)
Medication Instructions:   No changes  If you need a refill on your cardiac medications before your next appointment, please call your pharmacy.   Lab work: Not needed  Testing/Procedures:  Not needed Follow-Up: At [facility], you and your health needs are our priority.  As part of our continuing mission to provide you with exceptional heart care, we have created designated Provider Care Teams.  These Care Teams include your primary Cardiologist (physician) and Advanced Practice Providers (APPs -  Physician Assistants and Nurse Practitioners) who all work together to provide you with the care you need, when you need it. . You will need a follow up appointment in  6 months Dec 2020.  Please call our office 2 months in advance to schedule this appointment.  You may see Elouise Munroe, MD*or one of the following Advanced Practice Providers on your designated Care Team:   . Rosaria Ferries, PA-C . Jory Sims, DNP, ANP  Any Other Special Instructions Will Be Listed Below (If Applicable). N/A

## 2019-01-28 NOTE — Progress Notes (Signed)
Virtual Visit via Video Note   This visit type was conducted due to national recommendations for restrictions regarding the COVID-19 Pandemic (e.g. social distancing) in an effort to limit this patient's exposure and mitigate transmission in our community.  Due to his co-morbid illnesses, this patient is at least at moderate risk for complications without adequate follow up.  This format is felt to be most appropriate for this patient at this time.  All issues noted in this document were discussed and addressed.  A limited physical exam was performed with this format.  Please refer to the patient's chart for his consent to telehealth for [facility]   Date:  01/28/2019   ID:  Kent Bryan, DOB 1950/08/16, MRN [id number]  Patient Location: Home Provider Location: Home  PCP:  Kristen Loader, FNP  Cardiologist:  Elouise Munroe, MD  Electrophysiologist:  None   Evaluation Performed:  Follow-Up Visit  Chief Complaint:  F/u shortness of breath.   History of Present Illness:    Kent Bryan is a 68 y.o. male with paroxysmal atrial fibrillation s/p multiple ablations, and hyperlipidemia who presents today for follow up of shortness of breath.   He feels that initiation of methotrexate temporally correlates with sob, he wants to address this with dermatology and explore other options.   We obtained a CT pulmonary vein study that I have personally interpreted. Ordered because of history of multiple afib ablations to rule out pulmonary vein stenosis. Negative for pvein stenosis. No coronary calcium seen either.   He would like to discuss lipid therapy at next visit after lipid check in November. Will revisit in December at 6 mo follow up.   The patient does not have symptoms concerning for COVID-19 infection (fever, chills, cough, or new shortness of breath).    Past Medical History:  Diagnosis Date  . Atrial fibrillation ([place])   . Hyperlipidemia   . Ulcerative proctitis [facility])     Past Surgical History:  Procedure Laterality Date  . ATRIAL FIBRILLATION ABLATION    . TOTAL HIP ARTHROPLASTY Bilateral      Current Meds  Medication Sig  . B Complex Vitamins (B COMPLEX 1 PO) B Complex  1 qd  . folic acid (FOLVITE) 1 MG tablet Take 1 mg by mouth daily.  . mesalamine (LIALDA) 1.2 g EC tablet Take 1.2 g by mouth 2 (two) times a day.   . methotrexate 2.5 MG tablet Take 12.5 mg by mouth once a week. Caution: Chemotherapy. Protect from light.   . Multiple Vitamin (MULTI-VITAMIN DAILY PO) Take 1 tablet by mouth 2 (two) times a day.   . simvastatin (ZOCOR) 20 MG tablet Take 20 mg by mouth daily at 6 PM.      Allergies:   Patient has no known allergies.   Social History   Tobacco Use  . Smoking status: Former Research scientist (life sciences)  . Smokeless tobacco: Never Used  Substance Use Topics  . Alcohol use: Not on file  . Drug use: Not on file     Family Hx: The patient's family history includes Heart failure in his mother.  ROS:   Please see the history of present illness.     All other systems reviewed and are negative.   Prior CV studies:   The following studies were reviewed today:  CT pulmonary vein study 01/26/2019  Labs/Other Tests and Data Reviewed:    EKG:  No ECG reviewed.  Recent Labs: 01/26/2019: Creatinine, Ser 0.90   Recent Lipid Panel  No results found for: CHOL, TRIG, HDL, CHOLHDL, LDLCALC, LDLDIRECT  Wt Readings from Last 3 Encounters:  01/28/19 192 lb (87.1 kg)  12/26/18 190 lb (86.2 kg)  12/03/18 191 lb (86.6 kg)     Objective:    Vital Signs:  Ht 6' 1"  (1.854 m)   Wt 192 lb (87.1 kg)   BMI 25.33 kg/m    VITAL SIGNS:  reviewed GEN:  no acute distress EYES:  sclerae anicteric, EOMI - Extraocular Movements Intact RESPIRATORY:  normal respiratory effort, symmetric expansion CARDIOVASCULAR:  no peripheral edema SKIN:  no rash, lesions or ulcers. MUSCULOSKELETAL:  no obvious deformities. NEURO:  alert and oriented x 3, no obvious focal deficit  PSYCH:  normal affect  ASSESSMENT & PLAN:    1. Shortness of breath   2. Paroxysmal atrial fibrillation (HCC)   3. Hyperlipidemia, unspecified hyperlipidemia type   4. Palpitations    SOB - He is reassured that pulmonary veins do not look stenosed, and he is going to follow up with his other physicians to review methotrexate as a cause of his shortness of breath. He will keep me informed if there is any change in symptoms.  HLD - we will recheck lipids in 4moand we will discuss any need for change in statin therapy or other options.   Palpitations- stable, no rate control agents needed, in sinus with occasional SVT.    COVID-19 Education: The signs and symptoms of COVID-19 were discussed with the patient and how to seek care for testing (follow up with PCP or arrange E-visit).  The importance of social distancing was discussed today.  Time:   Today, I have spent 15 minutes with the patient with telehealth technology discussing the above problems.     Medication Adjustments/Labs and Tests Ordered: Current medicines are reviewed at length with the patient today.  Concerns regarding medicines are outlined above.   Tests Ordered: No orders of the defined types were placed in this encounter.   Medication Changes: No orders of the defined types were placed in this encounter.   Follow Up:  Virtual Visit or In Person in 6 month(s)  Signed, GElouise Munroe MD  01/28/2019 11:53 AM    [place]

## 2019-01-28 NOTE — Telephone Encounter (Signed)
SPOKE TO PATIENT - INSTRUCTION GIVEN FROM TELE-VISIT 01/28/19  - AVS SUMMARY WILL BE SENT VIA MYCHART . PATIENT VERBALIZED UNDERSTANDING.

## 2019-01-29 ENCOUNTER — Telehealth: Payer: Medicare Other | Admitting: Internal Medicine

## 2019-02-02 DIAGNOSIS — H43811 Vitreous degeneration, right eye: Secondary | ICD-10-CM | POA: Diagnosis not present

## 2019-02-02 DIAGNOSIS — H4311 Vitreous hemorrhage, right eye: Secondary | ICD-10-CM | POA: Diagnosis not present

## 2019-02-02 DIAGNOSIS — Z961 Presence of intraocular lens: Secondary | ICD-10-CM | POA: Diagnosis not present

## 2019-02-02 DIAGNOSIS — H33311 Horseshoe tear of retina without detachment, right eye: Secondary | ICD-10-CM | POA: Diagnosis not present

## 2019-02-09 ENCOUNTER — Encounter (HOSPITAL_COMMUNITY): Payer: Self-pay | Admitting: Internal Medicine

## 2019-02-23 DIAGNOSIS — L579 Skin changes due to chronic exposure to nonionizing radiation, unspecified: Secondary | ICD-10-CM | POA: Diagnosis not present

## 2019-02-23 DIAGNOSIS — L814 Other melanin hyperpigmentation: Secondary | ICD-10-CM | POA: Diagnosis not present

## 2019-02-23 DIAGNOSIS — Z79899 Other long term (current) drug therapy: Secondary | ICD-10-CM | POA: Diagnosis not present

## 2019-02-23 DIAGNOSIS — L209 Atopic dermatitis, unspecified: Secondary | ICD-10-CM | POA: Diagnosis not present

## 2019-02-25 LAB — LAB REPORT - SCANNED: EGFR: 89

## 2019-03-06 DIAGNOSIS — Z20828 Contact with and (suspected) exposure to other viral communicable diseases: Secondary | ICD-10-CM | POA: Diagnosis not present

## 2019-04-02 DIAGNOSIS — L209 Atopic dermatitis, unspecified: Secondary | ICD-10-CM | POA: Diagnosis not present

## 2019-04-02 DIAGNOSIS — L579 Skin changes due to chronic exposure to nonionizing radiation, unspecified: Secondary | ICD-10-CM | POA: Diagnosis not present

## 2019-04-02 DIAGNOSIS — L814 Other melanin hyperpigmentation: Secondary | ICD-10-CM | POA: Diagnosis not present

## 2019-05-01 DIAGNOSIS — M79672 Pain in left foot: Secondary | ICD-10-CM | POA: Diagnosis not present

## 2019-05-12 DIAGNOSIS — Z23 Encounter for immunization: Secondary | ICD-10-CM | POA: Diagnosis not present

## 2019-05-26 DIAGNOSIS — L814 Other melanin hyperpigmentation: Secondary | ICD-10-CM | POA: Diagnosis not present

## 2019-05-26 DIAGNOSIS — L579 Skin changes due to chronic exposure to nonionizing radiation, unspecified: Secondary | ICD-10-CM | POA: Diagnosis not present

## 2019-05-26 DIAGNOSIS — L209 Atopic dermatitis, unspecified: Secondary | ICD-10-CM | POA: Diagnosis not present

## 2019-05-27 ENCOUNTER — Encounter: Payer: Self-pay | Admitting: Podiatry

## 2019-05-27 ENCOUNTER — Other Ambulatory Visit: Payer: Self-pay

## 2019-05-27 ENCOUNTER — Ambulatory Visit (INDEPENDENT_AMBULATORY_CARE_PROVIDER_SITE_OTHER): Payer: Medicare Other | Admitting: Podiatry

## 2019-05-27 ENCOUNTER — Ambulatory Visit (INDEPENDENT_AMBULATORY_CARE_PROVIDER_SITE_OTHER): Payer: Medicare Other

## 2019-05-27 VITALS — BP 149/88 | HR 80 | Resp 16

## 2019-05-27 DIAGNOSIS — M722 Plantar fascial fibromatosis: Secondary | ICD-10-CM

## 2019-05-27 NOTE — Progress Notes (Signed)
   Subjective:    Patient ID: Kent Bryan, male    DOB: September 08, 1950, 68 y.o.   MRN: [id number]  HPI    Review of Systems  All other systems reviewed and are negative.      Objective:   Physical Exam        Assessment & Plan:

## 2019-05-27 NOTE — Patient Instructions (Signed)

## 2019-05-27 NOTE — Progress Notes (Signed)
Subjective:   Patient ID: Kent Bryan, male   DOB: 68 y.o.   MRN: [id number]   HPI Patient presents stating having a lot of pain in the bottom of the left heel that is been present around 6 months and gradually becoming more of an issue over that time.  Patient does not remember specific injury and does not smoke likes to be active   Review of Systems  All other systems reviewed and are negative.       Objective:  Physical Exam Vitals signs and nursing note reviewed.  Constitutional:      Appearance: He is well-developed.  Pulmonary:     Effort: Pulmonary effort is normal.  Musculoskeletal: Normal range of motion.  Skin:    General: Skin is warm.  Neurological:     Mental Status: He is alert.     Neuro vascular status intact muscle strength found to be adequate range of motion within normal limits with patient's left heel found to be very tender medial band at the insertional point tendon calcaneus with patient having good digital perfusion well oriented x3     Assessment:  Acute plantar fasciitis left with inflammation fluid buildup     Plan:  H&P reviewed condition and x-rays in today did sterile prep injected the plantar fascial left 3 mg Kenalog 76M Xylocaine applied fascial brace and gave instructions for physical therapy.  Patient will be seen back for [place] to recheck 2 weeks was given instructions for reduced activity over that time  X-rays indicated small spur formation no indications of stress fracture arthritis

## 2019-06-10 ENCOUNTER — Other Ambulatory Visit: Payer: Self-pay

## 2019-06-10 ENCOUNTER — Ambulatory Visit (INDEPENDENT_AMBULATORY_CARE_PROVIDER_SITE_OTHER): Payer: Medicare Other | Admitting: Podiatry

## 2019-06-10 ENCOUNTER — Encounter: Payer: Self-pay | Admitting: Podiatry

## 2019-06-10 DIAGNOSIS — M722 Plantar fascial fibromatosis: Secondary | ICD-10-CM

## 2019-06-10 NOTE — Progress Notes (Signed)
Subjective:   Patient ID: Kent Bryan, male   DOB: 68 y.o.   MRN: [id number]   HPI Patient states that the pain seemed to increase after the previous injection and then seemed to get better but it is now back to bothering him again worse when he gets up in the morning and after periods of sitting   ROS      Objective:  Physical Exam  Neurovascular status intact with continued discomfort plantar fascial left at the insertion tendon calcaneus with inflammation fluid apparent flareup after having the previous injection     Assessment:  H&P reviewed condition and discussed plantar fasciitis worse in the morning and after periods of sitting     Plan:  At this time I went ahead and I dispensed a night splint with all instructions on usage and gave instructions for physical therapy and supportive shoe along with rigid bottom shoes.  Patient will be seen back and we may have to do another injection but will get a hold off currently

## 2019-06-22 DIAGNOSIS — H4312 Vitreous hemorrhage, left eye: Secondary | ICD-10-CM | POA: Diagnosis not present

## 2019-06-22 DIAGNOSIS — H33311 Horseshoe tear of retina without detachment, right eye: Secondary | ICD-10-CM | POA: Diagnosis not present

## 2019-06-22 DIAGNOSIS — H33322 Round hole, left eye: Secondary | ICD-10-CM | POA: Diagnosis not present

## 2019-06-22 DIAGNOSIS — H33312 Horseshoe tear of retina without detachment, left eye: Secondary | ICD-10-CM | POA: Diagnosis not present

## 2019-06-22 DIAGNOSIS — H26492 Other secondary cataract, left eye: Secondary | ICD-10-CM | POA: Diagnosis not present

## 2019-06-23 DIAGNOSIS — H33312 Horseshoe tear of retina without detachment, left eye: Secondary | ICD-10-CM | POA: Diagnosis not present

## 2019-06-30 DIAGNOSIS — Z125 Encounter for screening for malignant neoplasm of prostate: Secondary | ICD-10-CM | POA: Diagnosis not present

## 2019-06-30 DIAGNOSIS — Z0000 Encounter for general adult medical examination without abnormal findings: Secondary | ICD-10-CM | POA: Diagnosis not present

## 2019-06-30 DIAGNOSIS — N528 Other male erectile dysfunction: Secondary | ICD-10-CM | POA: Diagnosis not present

## 2019-06-30 DIAGNOSIS — E785 Hyperlipidemia, unspecified: Secondary | ICD-10-CM | POA: Diagnosis not present

## 2019-06-30 DIAGNOSIS — I480 Paroxysmal atrial fibrillation: Secondary | ICD-10-CM | POA: Diagnosis not present

## 2019-06-30 DIAGNOSIS — K5120 Ulcerative (chronic) proctitis without complications: Secondary | ICD-10-CM | POA: Diagnosis not present

## 2019-07-01 ENCOUNTER — Ambulatory Visit: Payer: MEDICARE | Admitting: Podiatry

## 2019-07-06 DIAGNOSIS — E785 Hyperlipidemia, unspecified: Secondary | ICD-10-CM | POA: Diagnosis not present

## 2019-07-06 DIAGNOSIS — Z131 Encounter for screening for diabetes mellitus: Secondary | ICD-10-CM | POA: Diagnosis not present

## 2019-07-06 DIAGNOSIS — I480 Paroxysmal atrial fibrillation: Secondary | ICD-10-CM | POA: Diagnosis not present

## 2019-07-06 DIAGNOSIS — N528 Other male erectile dysfunction: Secondary | ICD-10-CM | POA: Diagnosis not present

## 2019-07-06 DIAGNOSIS — Z0000 Encounter for general adult medical examination without abnormal findings: Secondary | ICD-10-CM | POA: Diagnosis not present

## 2019-07-06 DIAGNOSIS — K5120 Ulcerative (chronic) proctitis without complications: Secondary | ICD-10-CM | POA: Diagnosis not present

## 2019-07-06 DIAGNOSIS — Z125 Encounter for screening for malignant neoplasm of prostate: Secondary | ICD-10-CM | POA: Diagnosis not present

## 2019-07-13 ENCOUNTER — Telehealth: Payer: Self-pay | Admitting: Internal Medicine

## 2019-07-13 DIAGNOSIS — H26491 Other secondary cataract, right eye: Secondary | ICD-10-CM | POA: Diagnosis not present

## 2019-07-13 NOTE — Telephone Encounter (Signed)
Yes. Please advise patient that there may be slight delays since we are seeing patients in the office that day as well. Happy to see virtually. GA

## 2019-07-13 NOTE — Telephone Encounter (Signed)
New Message:       Pt wants to know if he can have a Virtual Visit for his appt on 07-20-19 please?

## 2019-07-13 NOTE — Telephone Encounter (Signed)
Will route to MD to advise if okay to do Virtual visit for 12/07 appointment.

## 2019-07-14 NOTE — Telephone Encounter (Signed)
Follow up:          Patient would like to know if he can have a VV. Please call patient.

## 2019-07-15 ENCOUNTER — Ambulatory Visit: Payer: MEDICARE | Admitting: Podiatry

## 2019-07-20 ENCOUNTER — Telehealth (INDEPENDENT_AMBULATORY_CARE_PROVIDER_SITE_OTHER): Payer: Medicare Other | Admitting: Internal Medicine

## 2019-07-20 VITALS — Ht 73.0 in | Wt 195.0 lb

## 2019-07-20 DIAGNOSIS — Z5181 Encounter for therapeutic drug level monitoring: Secondary | ICD-10-CM

## 2019-07-20 DIAGNOSIS — R0602 Shortness of breath: Secondary | ICD-10-CM | POA: Diagnosis not present

## 2019-07-20 DIAGNOSIS — I480 Paroxysmal atrial fibrillation: Secondary | ICD-10-CM | POA: Diagnosis not present

## 2019-07-20 DIAGNOSIS — R002 Palpitations: Secondary | ICD-10-CM | POA: Diagnosis not present

## 2019-07-20 DIAGNOSIS — E785 Hyperlipidemia, unspecified: Secondary | ICD-10-CM

## 2019-07-20 MED ORDER — ATORVASTATIN CALCIUM 40 MG PO TABS: 40.0000 mg | ORAL_TABLET | Freq: Every day | ORAL | 3 refills | Status: DC

## 2019-07-20 NOTE — Patient Instructions (Addendum)
Medication Instructions:  STOP SIMVASTATIN   START ATORVASTATIN 40 MG DAILY   *If you need a refill on your cardiac medications before your next appointment, please call your pharmacy*  Lab Work: FASTING LP/CMET IN 3 MONTHS   If you have labs (blood work) drawn today and your tests are completely normal, you will receive your results only by: Marland Kitchen MyChart Message (if you have MyChart) OR . A paper copy in the mail If you have any lab test that is abnormal or we need to change your treatment, we will call you to review the results.  Testing/Procedures: NONE   Follow-Up: At [facility], you and your health needs are our priority.  As part of our continuing mission to provide you with exceptional heart care, we have created designated Provider Care Teams.  These Care Teams include your primary Cardiologist (physician) and Advanced Practice Providers (APPs -  Physician Assistants and Nurse Practitioners) who all work together to provide you with the care you need, when you need it.  Your next appointment:   6 month(s) You will receive a reminder letter in the mail two months in advance. If you don't receive a letter, please call our office to schedule the follow-up appointment.  The format for your next appointment:   Either In Person or Virtual  Provider:   You may see Elouise Munroe, MD or one of the following Advanced Practice Providers on your designated Care Team:    Rosaria Ferries, PA-C  Jory Sims, DNP, ANP  Cadence Kathlen Mody, NP

## 2019-07-20 NOTE — Progress Notes (Signed)
Virtual Visit via Telephone Note   This visit type was conducted due to national recommendations for restrictions regarding the COVID-19 Pandemic (e.g. social distancing) in an effort to limit this patient's exposure and mitigate transmission in our community.  Due to his co-morbid illnesses, this patient is at least at moderate risk for complications without adequate follow up.  This format is felt to be most appropriate for this patient at this time.  The patient did not have access to video technology/had technical difficulties with video requiring transitioning to audio format only (telephone).  All issues noted in this document were discussed and addressed.  No physical exam could be performed with this format.  Please refer to the patient's chart for his  consent to telehealth for [facility].   Date:  07/20/2019   ID:  Kent Bryan, DOB [birth year], MRN [id number]  Patient Location: Home Provider Location: Office  PCP:  Kristen Loader, FNP  Cardiologist:  Elouise Munroe, MD  Electrophysiologist:  None   Evaluation Performed:  Follow-Up Visit  Chief Complaint:  F/u SVT, SOB, and hyperlipidemia.   History of Present Illness:    Kent Bryan is a 68 y.o. male with paroxysmal atrial fibrillation, SVT, and hyperlipidemia who presents today for follow up of palpitations and shortness of breath.  He feels he has had no episodes of SVT and has had no palpitations.  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  He is greatly enjoyed this and has had an opportunity to listen to jazz music which she enjoys while driving his car.  He previously experienced shortness of breath but feels that once he stopped taking methotrexate this symptom has entirely gone away.  He had a lipid panel drawn with his primary care provider which demonstrated an HDL of 55, LDL of 106, and triglycerides of 182.  We discussed intensification of  statin therapy. Svt has not been occurring, no palpitations.   The patient denies chest pain, chest pressure, dyspnea at rest or with exertion, palpitations, PND, orthopnea, or leg swelling. Denies syncope or presyncope. Denies dizziness or lightheadedness.  No cough, fever, chills, nausea, vomiting.  The patient does not have symptoms concerning for COVID-19 infection (fever, chills, cough, or new shortness of breath).    Past Medical History:  Diagnosis Date  . Atrial fibrillation ([place])   . Hyperlipidemia   . Ulcerative proctitis [facility])    Past Surgical History:  Procedure Laterality Date  . ATRIAL FIBRILLATION ABLATION    . TOTAL HIP ARTHROPLASTY Bilateral      Current Meds  Medication Sig  . B Complex Vitamins (B COMPLEX 1 PO) B Complex  1 qd  . mesalamine (LIALDA) 1.2 g EC tablet Take 1.2 g by mouth 2 (two) times a day.   . Multiple Vitamin (MULTI-VITAMIN DAILY PO) Take 1 tablet by mouth 2 (two) times a day.   . simvastatin (ZOCOR) 20 MG tablet Take 20 mg by mouth daily at 6 PM.      Allergies:   Methotrexate derivatives   Social History   Tobacco Use  . Smoking status: Former Research scientist (life sciences)  . Smokeless tobacco: Never Used  Substance Use Topics  . Alcohol use: Not on file  . Drug use: Not on file     Family Hx: The patient's family history includes Heart failure in his mother.  ROS:   Please see the history of present illness.  All other systems reviewed and are negative.   Prior CV studies:   The following studies were reviewed today:    Labs/Other Tests and Data Reviewed:    EKG:  No ECG reviewed.  Recent Labs: 01/26/2019: Creatinine, Ser 0.90   Recent Lipid Panel No results found for: CHOL, TRIG, HDL, CHOLHDL, LDLCALC, LDLDIRECT  Wt Readings from Last 3 Encounters:  07/20/19 195 lb (88.5 kg)  01/28/19 192 lb (87.1 kg)  12/26/18 190 lb (86.2 kg)     Objective:    Vital Signs:  Ht 6' 1"  (1.854 m)   Wt 195 lb (88.5 kg)   BMI 25.73 kg/m     VITAL SIGNS:  reviewed GEN:  no acute distress  ASSESSMENT & PLAN:    1. Shortness of breath   Resolved after stopping methotrexate  2. Paroxysmal atrial fibrillation (HCC)   In sinus rhythm per patient.  3. Hyperlipidemia, unspecified hyperlipidemia type  We will intensify statin therapy today and transition to atorvastatin 40 mg daily.  Patient has been counseled on side effects of statin therapy.  We will recheck lipids in 3 months to evaluate for improvement in LDL.  If we are not at goal we will have discussions about further risk factor modification.  4. Palpitations  No current symptoms of palpitations    COVID-19 Education: The signs and symptoms of COVID-19 were discussed with the patient and how to seek care for testing (follow up with PCP or arrange E-visit).  The importance of social distancing was discussed today.  Time:   Today, I have spent 25 minutes with the patient with telehealth technology discussing the above problems.     Medication Adjustments/Labs and Tests Ordered: Current medicines are reviewed at length with the patient today.  Concerns regarding medicines are outlined above.   Tests Ordered: No orders of the defined types were placed in this encounter.   Medication Changes: No orders of the defined types were placed in this encounter.   Follow Up:  Virtual Visit  in 6 month(s)  Signed, Elouise Munroe, MD  07/20/2019 9:39 AM    [name]

## 2019-07-22 NOTE — Telephone Encounter (Signed)
APPOINTMENT WAS  SCHEDULE ON 07/20/19

## 2019-10-21 LAB — COMPREHENSIVE METABOLIC PANEL
ALT: 20 IU/L (ref 0–44)
AST: 28 IU/L (ref 0–40)
Albumin/Globulin Ratio: 2.1 (ref 1.2–2.2)
Albumin: 4.6 g/dL (ref 3.8–4.8)
Alkaline Phosphatase: 70 IU/L (ref 39–117)
BUN/Creatinine Ratio: 12 (ref 10–24)
BUN: 13 mg/dL (ref 8–27)
Bilirubin Total: 0.6 mg/dL (ref 0.0–1.2)
CO2: 27 mmol/L (ref 20–29)
Calcium: 9.5 mg/dL (ref 8.6–10.2)
Chloride: 100 mmol/L (ref 96–106)
Creatinine, Ser: 1.12 mg/dL (ref 0.76–1.27)
GFR calc Af Amer: 77 mL/min/{1.73_m2} (ref >59)
GFR calc non Af Amer: 67 mL/min/{1.73_m2} (ref >59)
Globulin, Total: 2.2 g/dL (ref 1.5–4.5)
Glucose: 104 mg/dL — ABNORMAL HIGH (ref 65–99)
Potassium: 5.2 mmol/L (ref 3.5–5.2)
Sodium: 139 mmol/L (ref 134–144)
Total Protein: 6.8 g/dL (ref 6.0–8.5)

## 2019-10-21 LAB — LIPID PANEL
Chol/HDL Ratio: 2.7 ratio (ref 0.0–5.0)
Cholesterol, Total: 148 mg/dL (ref 100–199)
HDL: 54 mg/dL (ref >39)
LDL Chol Calc (NIH): 73 mg/dL (ref 0–99)
Triglycerides: 116 mg/dL (ref 0–149)
VLDL Cholesterol Cal: 21 mg/dL (ref 5–40)

## 2019-11-04 ENCOUNTER — Encounter (INDEPENDENT_AMBULATORY_CARE_PROVIDER_SITE_OTHER): Payer: Self-pay | Admitting: Ophthalmology

## 2019-11-16 ENCOUNTER — Encounter (INDEPENDENT_AMBULATORY_CARE_PROVIDER_SITE_OTHER): Payer: Medicare Other | Admitting: Ophthalmology

## 2020-06-07 ENCOUNTER — Encounter: Payer: Self-pay | Admitting: Internal Medicine

## 2020-06-07 ENCOUNTER — Ambulatory Visit (INDEPENDENT_AMBULATORY_CARE_PROVIDER_SITE_OTHER): Payer: Medicare Other | Admitting: Internal Medicine

## 2020-06-07 ENCOUNTER — Other Ambulatory Visit: Payer: Self-pay

## 2020-06-07 VITALS — BP 122/68 | HR 60 | Ht 73.0 in | Wt 205.0 lb

## 2020-06-07 DIAGNOSIS — I480 Paroxysmal atrial fibrillation: Secondary | ICD-10-CM

## 2020-06-07 DIAGNOSIS — R002 Palpitations: Secondary | ICD-10-CM | POA: Diagnosis not present

## 2020-06-07 DIAGNOSIS — E785 Hyperlipidemia, unspecified: Secondary | ICD-10-CM | POA: Diagnosis not present

## 2020-06-07 MED ORDER — ATORVASTATIN CALCIUM 40 MG PO TABS: 40.0000 mg | ORAL_TABLET | Freq: Every day | ORAL | 3 refills | Status: DC

## 2020-06-07 NOTE — Patient Instructions (Signed)
Medication Instructions:  No Changes In Medications at this time.  *If you need a refill on your cardiac medications before your next appointment, please call your pharmacy*  Lab Work: None Ordered At This Time.  If you have labs (blood work) drawn today and your tests are completely normal, you will receive your results only by: Marland Kitchen MyChart Message (if you have MyChart) OR . A paper copy in the mail If you have any lab test that is abnormal or we need to change your treatment, we will call you to review the results.  Testing/Procedures: None Ordered At This Time.   Follow-Up: At [facility], you and your health needs are our priority.  As part of our continuing mission to provide you with exceptional heart care, we have created designated Provider Care Teams.  These Care Teams include your primary Cardiologist (physician) and Advanced Practice Providers (APPs -  Physician Assistants and Nurse Practitioners) who all work together to provide you with the care you need, when you need it.  We recommend signing up for the patient portal called "MyChart".  Sign up information is provided on this After Visit Summary.  MyChart is used to connect with patients for Virtual Visits (Telemedicine).  Patients are able to view lab/test results, encounter notes, upcoming appointments, etc.  Non-urgent messages can be sent to your provider as well.   To learn more about what you can do with MyChart, go to [email or web address].    Your next appointment:   1 year(s)  The format for your next appointment:   In Person  Provider:   Cherlynn Kaiser, MD

## 2020-06-07 NOTE — Progress Notes (Signed)
Cardiology Office Note:    Date:  06/07/2020   ID:  Kent Bryan, DOB 10/10/1950, MRN [id number]  PCP:  Kristen Loader, FNP  Cardiologist:  Elouise Munroe, MD  Electrophysiologist:  None   Referring MD: Kristen Loader, FNP   Chief Complaint/Reason for Referral: Afib  History of Present Illness:    Kent Bryan is a 69 y.o. male with a history of paroxysmal atrial fibrillation s/p 4 prior ablations, SVT, and hyperlipidemia who presents today for follow up of palpitations and shortness of breath.   Overall feeling well and denies recurrence of atrial fibrillation. Occasional palpitations, not bothersome.   Denies chest pain, shortness of breath, leg swelling, syncope.  This patients CHA2DS2-VASc Score and unadjusted Ischemic Stroke Rate (% per year) is equal to 0.6 % stroke rate/year from a score of 1  Above score calculated as 1 point each if present [CHF, HTN, DM, Vascular=MI/PAD/Aortic Plaque, Age if 65-74, or Male] Above score calculated as 2 points each if present [Age > 75, or Stroke/TIA/TE]   Past Medical History:  Diagnosis Date  . Atrial fibrillation ([place])   . Hyperlipidemia   . Ulcerative proctitis [facility])     Past Surgical History:  Procedure Laterality Date  . ATRIAL FIBRILLATION ABLATION    . TOTAL HIP ARTHROPLASTY Bilateral     Current Medications: Current Meds  Medication Sig  . B Complex Vitamins (B COMPLEX 1 PO) B Complex  1 qd  . mesalamine (LIALDA) 1.2 g EC tablet Take 1.2 g by mouth 2 (two) times a day.   . Multiple Vitamin (MULTI-VITAMIN DAILY PO) Take 1 tablet by mouth 2 (two) times a day.      Allergies:   Methotrexate derivatives   Social History   Tobacco Use  . Smoking status: Former Research scientist (life sciences)  . Smokeless tobacco: Never Used  Substance Use Topics  . Alcohol use: Not on file  . Drug use: Not on file     Family History: The patient's family history includes Heart failure in his mother.  ROS:   Please see the history of present  illness.    All other systems reviewed and are negative.  EKGs/Labs/Other Studies Reviewed:    The following studies were reviewed today:  EKG:  NSR, sinus arrhythmia  Recent Labs: 10/20/2019: ALT 20; BUN 13; Creatinine, Ser 1.12; Potassium 5.2; Sodium 139  Recent Lipid Panel    Component Value Date/Time   CHOL 148 10/20/2019 0826   TRIG 116 10/20/2019 0826   HDL 54 10/20/2019 0826   CHOLHDL 2.7 10/20/2019 0826   LDLCALC 73 10/20/2019 0826    Physical Exam:    VS:  BP 122/68 (BP Location: Left Arm, Patient Position: Sitting, Cuff Size: Normal)   Pulse 60   Ht 6' 1"  (1.854 m)   Wt 205 lb (93 kg)   BMI 27.05 kg/m     Wt Readings from Last 5 Encounters:  06/07/20 205 lb (93 kg)  07/20/19 195 lb (88.5 kg)  01/28/19 192 lb (87.1 kg)  12/26/18 190 lb (86.2 kg)  12/03/18 191 lb (86.6 kg)    Constitutional: No acute distress Eyes: sclera non-icteric, normal conjunctiva and lids ENMT: normal dentition, moist mucous membranes Cardiovascular: regular rhythm, normal rate, no murmurs. S1 and S2 normal. Radial pulses normal bilaterally. No jugular venous distention.  Respiratory: clear to auscultation bilaterally GI : normal bowel sounds, soft and nontender. No distention.   MSK: extremities warm, well perfused. No edema.  NEURO: grossly nonfocal exam,  moves all extremities. PSYCH: alert and oriented x 3, normal mood and affect.   ASSESSMENT:    1. Paroxysmal atrial fibrillation (HCC)   2. Palpitations   3. Hyperlipidemia, unspecified hyperlipidemia type    PLAN:    Paroxysmal atrial fibrillation ([place]) - Plan: EKG 12-Lead Palpitations - no recurrence of afib after last ablation at [facility] in 2014. Rare palpitations may represent SVT.  Chads2vasc is 1 for age, will monitor with time.   Hyperlipidemia,  - intensified statin therapy at last visit with good result in LDL, now 73. Continue atorvastatin 40 mg daily, will refill today.   Total time of encounter: 20 minutes total  time of encounter, including 15 minutes spent in face-to-face patient care on the date of this encounter. This time includes coordination of care and counseling regarding above mentioned problem list. Remainder of non-face-to-face time involved reviewing chart documents/testing relevant to the patient encounter and documentation in the medical record. I have independently reviewed documentation from referring provider.   Cherlynn Kaiser, MD [place]  [facility]    Medication Adjustments/Labs and Tests Ordered: Current medicines are reviewed at length with the patient today.  Concerns regarding medicines are outlined above.   Orders Placed This Encounter  Procedures  . EKG 12-Lead    Meds ordered this encounter  Medications  . atorvastatin (LIPITOR) 40 MG tablet    Sig: Take 1 tablet (40 mg total) by mouth daily.    Dispense:  90 tablet    Refill:  3    D/C SIMVASTATIN    Patient Instructions  Medication Instructions:  No Changes In Medications at this time.  *If you need a refill on your cardiac medications before your next appointment, please call your pharmacy*  Lab Work: None Ordered At This Time.  If you have labs (blood work) drawn today and your tests are completely normal, you will receive your results only by: Marland Kitchen MyChart Message (if you have MyChart) OR . A paper copy in the mail If you have any lab test that is abnormal or we need to change your treatment, we will call you to review the results.  Testing/Procedures: None Ordered At This Time.   Follow-Up: At [facility], you and your health needs are our priority.  As part of our continuing mission to provide you with exceptional heart care, we have created designated Provider Care Teams.  These Care Teams include your primary Cardiologist (physician) and Advanced Practice Providers (APPs -  Physician Assistants and Nurse Practitioners) who all work together to provide you with the care you need, when you  need it.  We recommend signing up for the patient portal called "MyChart".  Sign up information is provided on this After Visit Summary.  MyChart is used to connect with patients for Virtual Visits (Telemedicine).  Patients are able to view lab/test results, encounter notes, upcoming appointments, etc.  Non-urgent messages can be sent to your provider as well.   To learn more about what you can do with MyChart, go to [email or web address].    Your next appointment:   1 year(s)  The format for your next appointment:   In Person  Provider:   Cherlynn Kaiser, MD

## 2020-09-10 IMAGING — CT CT HEART MORPH/PULM VEIN WITH CONTRAST AND WITHOUT CORONARY CALC
3 of 6 series · 11 of 20 positions shown, 13 images · IV contrast (APPLIED)
Comparison: None.
COMPARISON: None.

Addendum:
EXAM:
OVER-READ INTERPRETATION  CT CHEST

The following report is an over-read performed by radiologist Dr.
Giorgi Jumper [REDACTED] on 01/26/2019. This over-read
does not include interpretation of cardiac or coronary anatomy or
pathology. The coronary CTA interpretation by the cardiologist is
attached.
CLINICAL DATA: .:
CLINICAL DATA: .
Shortness of breath with history of multiple AF ablations, query
pulmonary vein stenosis.
No previous studies available for comparison.
Cardiac CT/CTA
TECHNIQUE: The patient was scanned on a Siemens Somatom scanner.

[Series 6: best diast · axial · 0.39mm/px · z∈[+1102,+1197]mm · 4 of 395 slices shown]
[im 79/395  vessel]
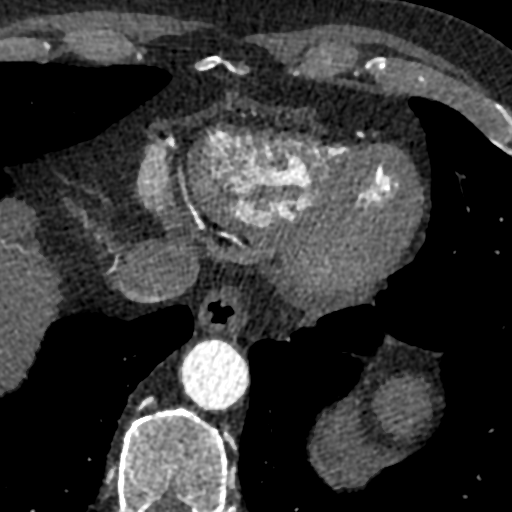
[im 158/395  vessel]
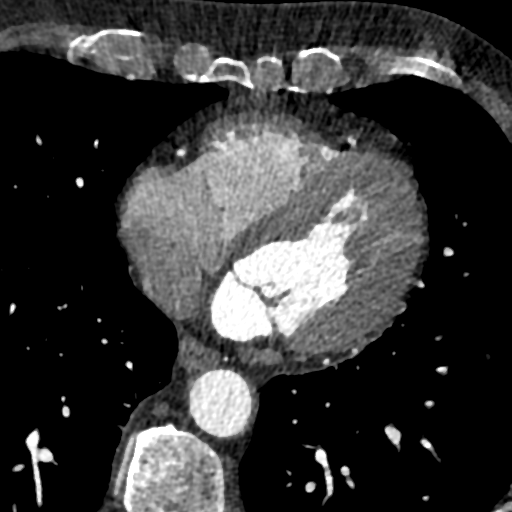
[im 237/395  vessel]
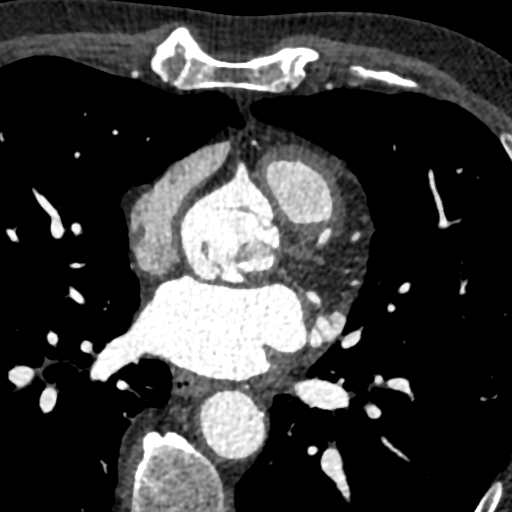
[im 316/395  vessel]
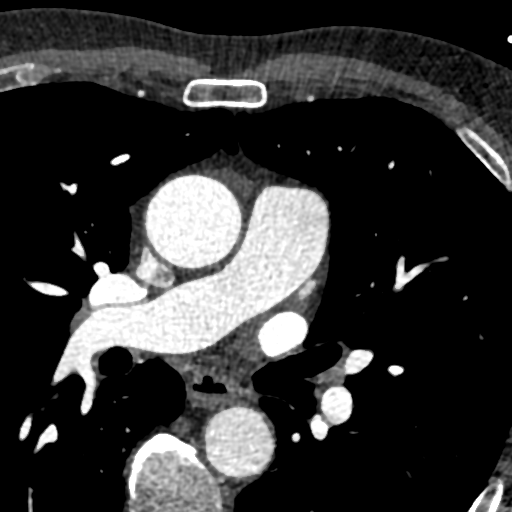

[Series 7: +300 ms · axial · 0.39mm/px · z∈[+1097,+1202]mm · 5 of 395 slices shown, 7 images]
[im 66/395  vessel]
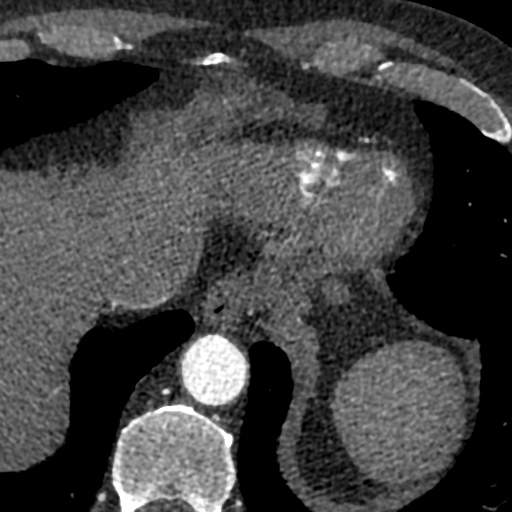
[im 66/395  lung]
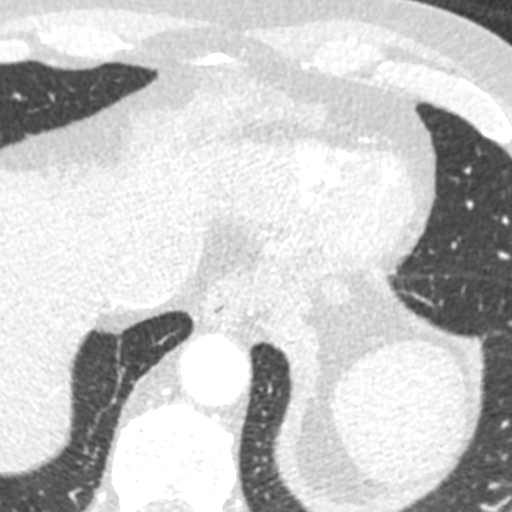
[im 132/395  vessel]
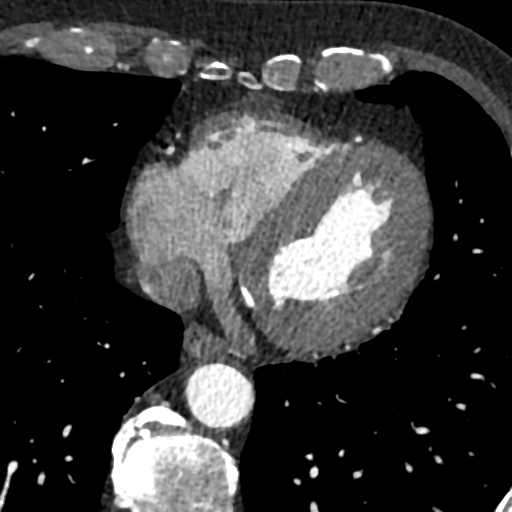
[im 198/395  vessel]
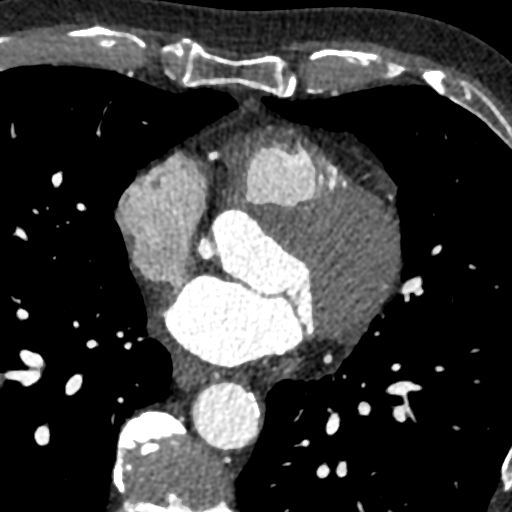
[im 263/395  vessel]
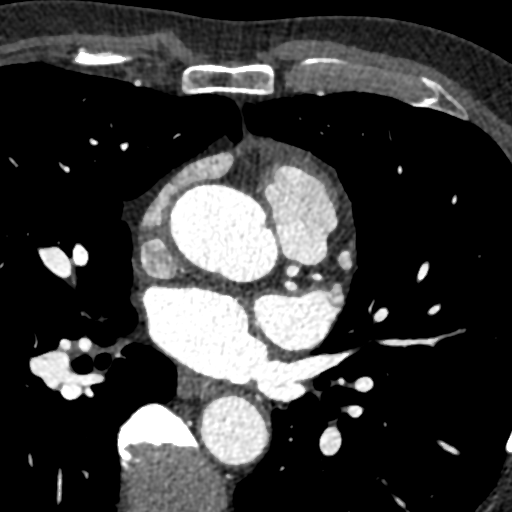
[im 329/395  vessel]
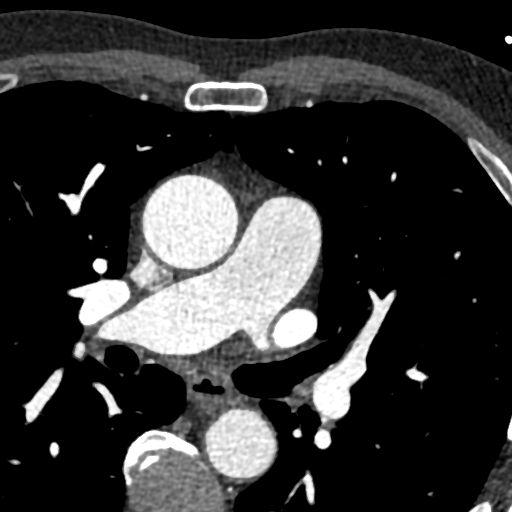
[im 329/395  lung]
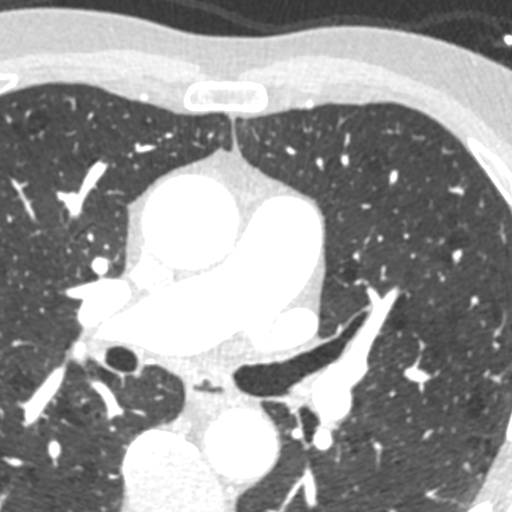

[Series 14: +(id) delay · axial · delayed · 0.39mm/px · z∈[+1154,+1191]mm · 2 of 227 slices shown]
[im 76/227  vessel]
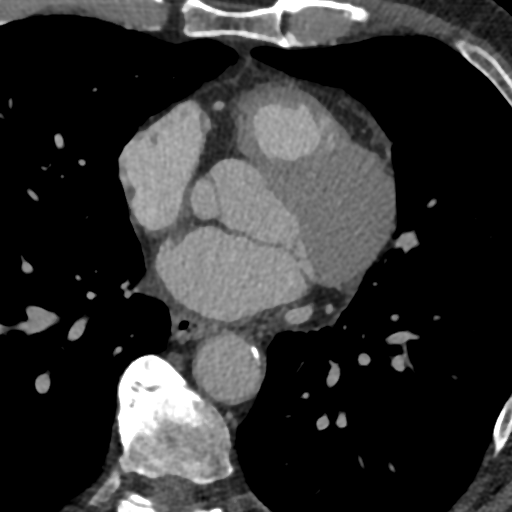
[im 151/227  vessel]
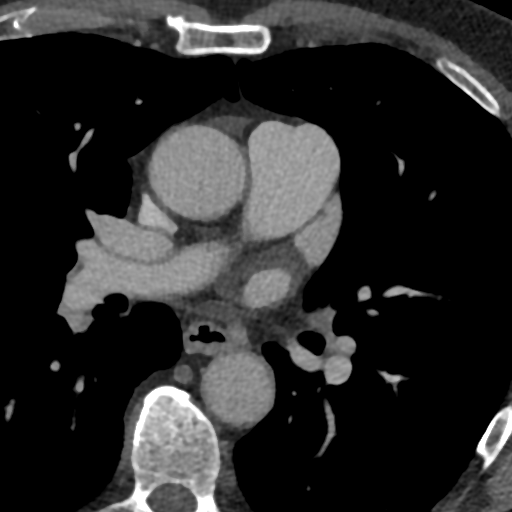

[11 of 20 positions shown; findings below may reference images not displayed]

FINDINGS: Vascular: Heart is normal size. Aorta is normal caliber. Scattered
aortic calcifications in the visualized descending thoracic aorta.

Mediastinum/Nodes: No adenopathy in the lower mediastinum or hila.

Lungs/Pleura: Mild centrilobular emphysema. No confluent opacities
or effusions.

Upper Abdomen: Imaging into the upper abdomen shows no acute
findings.

Musculoskeletal: Chest wall soft tissues are unremarkable. No acute
bony abnormality.
IMPRESSION: Mild centrilobular emphysema.

Scattered descending aortic atherosclerosis.

No acute extra cardiac abnormality.
FINDINGS: A 120 kV prospective scan was triggered in the descending thoracic
aorta at 111 HU's. Gantry rotation speed was 280 msecs and
collimation was .9 mm. No beta blockade and no NTG was given. The 3D
data set was reconstructed in 5% intervals of the 60-80 % of the R-R
cycle. Diastolic phases were analyzed on a dedicated work station
using MPR, MIP and VRT modes. The patient received 80 cc of
contrast.

There is normal pulmonary vein drainage into the left atrium (3 on
the right and 2 on the left) with right middle lobe PV entering the
left atrium through a separate antrum.

Ostial measurements as follows:

RUPV: 20.0 x 16.4 mm

RLPV: 13.0 x 12.3 mm

LUPV: 13.5 x 12.3 mm

LLPV: 14.8 x 11.4 mm

No anomalous pulmonary venous drainage.

Normal left atrial appendage, no left atrial appendage thrombus. No
intracardiac mass or thrombus.

The esophagus runs in the left atrial midline and is not in the
proximity to any of the pulmonary veins.

Aorta: Normal caliber. No dissection or calcifications. 36 mm in
maximal dimension measured double oblique (orthogonal) at the mid
ascending aorta.

Aortic Valve:  Trileaflet.  No calcifications.

Coronary Arteries: Normal coronary origin. Right dominance. The
study was performed without use of NTG and insufficient for plaque
evaluation.

Pulmonary artery: Normal caliber main pulmonary artery.

Likely iatrogenic atrial septal defect with left to right shunt.
Calcifications along the atrial septum.
IMPRESSION: 1. There is normal pulmonary vein drainage into the left atrium. No
pulmonary vein stenosis s/p multiple atrial fibrillation ablations
with pulmonary vein isolation.

2. Normal left atrial appendage, no left atrial appendage thrombus.
No intracardiac mass or thrombus.

3. The esophagus runs in the left atrial midline and is not in the
proximity to any of the pulmonary veins.

4. Likely iatrogenic atrial septal defect with left to right shunt.
Calcifications along the atrial septum.

No studies available for direct comparison.

*** End of Addendum ***
EXAM:
OVER-READ INTERPRETATION  CT CHEST

The following report is an over-read performed by radiologist Dr.
Giorgi Jumper [REDACTED] on 01/26/2019. This over-read
does not include interpretation of cardiac or coronary anatomy or
pathology. The coronary CTA interpretation by the cardiologist is
attached.
FINDINGS: Vascular: Heart is normal size. Aorta is normal caliber. Scattered
aortic calcifications in the visualized descending thoracic aorta.

Mediastinum/Nodes: No adenopathy in the lower mediastinum or hila.

Lungs/Pleura: Mild centrilobular emphysema. No confluent opacities
or effusions.

Upper Abdomen: Imaging into the upper abdomen shows no acute
findings.

Musculoskeletal: Chest wall soft tissues are unremarkable. No acute
bony abnormality.
IMPRESSION: Mild centrilobular emphysema.

Scattered descending aortic atherosclerosis.

No acute extra cardiac abnormality.

## 2020-10-20 DIAGNOSIS — L814 Other melanin hyperpigmentation: Secondary | ICD-10-CM | POA: Diagnosis not present

## 2020-10-20 DIAGNOSIS — L579 Skin changes due to chronic exposure to nonionizing radiation, unspecified: Secondary | ICD-10-CM | POA: Diagnosis not present

## 2020-10-20 DIAGNOSIS — L821 Other seborrheic keratosis: Secondary | ICD-10-CM | POA: Diagnosis not present

## 2020-10-20 DIAGNOSIS — L209 Atopic dermatitis, unspecified: Secondary | ICD-10-CM | POA: Diagnosis not present

## 2020-10-20 DIAGNOSIS — L820 Inflamed seborrheic keratosis: Secondary | ICD-10-CM | POA: Diagnosis not present

## 2020-11-16 DIAGNOSIS — Z23 Encounter for immunization: Secondary | ICD-10-CM | POA: Diagnosis not present

## 2021-02-02 DIAGNOSIS — I480 Paroxysmal atrial fibrillation: Secondary | ICD-10-CM | POA: Diagnosis not present

## 2021-02-02 DIAGNOSIS — K649 Unspecified hemorrhoids: Secondary | ICD-10-CM | POA: Diagnosis not present

## 2021-02-02 DIAGNOSIS — H6983 Other specified disorders of Eustachian tube, bilateral: Secondary | ICD-10-CM | POA: Diagnosis not present

## 2021-02-02 DIAGNOSIS — H838X3 Other specified diseases of inner ear, bilateral: Secondary | ICD-10-CM | POA: Diagnosis not present

## 2021-02-02 DIAGNOSIS — H903 Sensorineural hearing loss, bilateral: Secondary | ICD-10-CM | POA: Diagnosis not present

## 2021-02-02 DIAGNOSIS — K5120 Ulcerative (chronic) proctitis without complications: Secondary | ICD-10-CM | POA: Diagnosis not present

## 2021-02-03 DIAGNOSIS — R7301 Impaired fasting glucose: Secondary | ICD-10-CM | POA: Diagnosis not present

## 2021-02-03 DIAGNOSIS — M79641 Pain in right hand: Secondary | ICD-10-CM | POA: Diagnosis not present

## 2021-02-03 DIAGNOSIS — R7303 Prediabetes: Secondary | ICD-10-CM | POA: Diagnosis not present

## 2021-02-03 DIAGNOSIS — Z125 Encounter for screening for malignant neoplasm of prostate: Secondary | ICD-10-CM | POA: Diagnosis not present

## 2021-02-03 DIAGNOSIS — K5120 Ulcerative (chronic) proctitis without complications: Secondary | ICD-10-CM | POA: Diagnosis not present

## 2021-02-03 DIAGNOSIS — Z0000 Encounter for general adult medical examination without abnormal findings: Secondary | ICD-10-CM | POA: Diagnosis not present

## 2021-02-03 DIAGNOSIS — E785 Hyperlipidemia, unspecified: Secondary | ICD-10-CM | POA: Diagnosis not present

## 2021-02-03 DIAGNOSIS — N528 Other male erectile dysfunction: Secondary | ICD-10-CM | POA: Diagnosis not present

## 2021-02-04 LAB — LAB REPORT - SCANNED
A1c: 5.9
EGFR: 80

## 2021-02-12 DIAGNOSIS — Z23 Encounter for immunization: Secondary | ICD-10-CM | POA: Diagnosis not present

## 2021-04-29 DIAGNOSIS — Z23 Encounter for immunization: Secondary | ICD-10-CM | POA: Diagnosis not present

## 2021-06-03 ENCOUNTER — Other Ambulatory Visit: Payer: Self-pay | Admitting: Internal Medicine

## 2021-06-03 DIAGNOSIS — Z23 Encounter for immunization: Secondary | ICD-10-CM | POA: Diagnosis not present

## 2021-06-12 DIAGNOSIS — K648 Other hemorrhoids: Secondary | ICD-10-CM | POA: Diagnosis not present

## 2021-06-12 DIAGNOSIS — Z86010 Personal history of colonic polyps: Secondary | ICD-10-CM | POA: Diagnosis not present

## 2021-06-12 DIAGNOSIS — K5120 Ulcerative (chronic) proctitis without complications: Secondary | ICD-10-CM | POA: Diagnosis not present

## 2021-06-14 DIAGNOSIS — K5120 Ulcerative (chronic) proctitis without complications: Secondary | ICD-10-CM | POA: Diagnosis not present

## 2021-06-28 NOTE — Telephone Encounter (Signed)
*  STAT* If patient is at the pharmacy, call can be transferred to refill team.   1. Which medications need to be refilled? (please list name of each medication and dose if known) atorvastatin (LIPITOR) 40 MG tablet  2. Which pharmacy/location (including street and city if local pharmacy) is medication to be sent to? [facility] [id number], [place]  3. Do they need a 30 day or 90 day supply? 90 day

## 2021-08-21 DIAGNOSIS — H6091 Unspecified otitis externa, right ear: Secondary | ICD-10-CM | POA: Diagnosis not present

## 2021-08-24 DIAGNOSIS — K5120 Ulcerative (chronic) proctitis without complications: Secondary | ICD-10-CM | POA: Diagnosis not present

## 2021-08-24 DIAGNOSIS — K648 Other hemorrhoids: Secondary | ICD-10-CM | POA: Diagnosis not present

## 2021-09-11 DIAGNOSIS — L820 Inflamed seborrheic keratosis: Secondary | ICD-10-CM | POA: Diagnosis not present

## 2021-09-11 DIAGNOSIS — L209 Atopic dermatitis, unspecified: Secondary | ICD-10-CM | POA: Diagnosis not present

## 2021-09-24 ENCOUNTER — Other Ambulatory Visit: Payer: Self-pay | Admitting: Internal Medicine

## 2021-10-25 ENCOUNTER — Telehealth: Payer: Self-pay | Admitting: Internal Medicine

## 2021-10-25 NOTE — Telephone Encounter (Signed)
?*  STAT* If patient is at the pharmacy, call can be transferred to refill team. ? ? ?1. Which medications need to be refilled? (please list name of each medication and dose if known)  ?atorvastatin (LIPITOR) 40 MG tablet ? ?2. Which pharmacy/location (including street and city if local pharmacy) is medication to be sent to? ?[facility] [id number],  - [place] ? ?3. Do they need a 30 day or 90 day supply? 90 with refills  ? ?Patient is scheduled to see Dr. Margaretann Loveless 01/15/22 ?

## 2021-10-26 MED ORDER — ATORVASTATIN CALCIUM 40 MG PO TABS: 40.0000 mg | ORAL_TABLET | Freq: Every day | ORAL | 0 refills | Status: DC

## 2021-10-26 NOTE — Telephone Encounter (Signed)
Atorvastatin sent to pts pharmacy. Pt needs to keep apt for additional refills.  ?

## 2021-10-30 DIAGNOSIS — M545 Low back pain, unspecified: Secondary | ICD-10-CM | POA: Diagnosis not present

## 2022-01-03 DIAGNOSIS — Z23 Encounter for immunization: Secondary | ICD-10-CM | POA: Diagnosis not present

## 2022-01-15 ENCOUNTER — Encounter: Payer: Self-pay | Admitting: Internal Medicine

## 2022-01-15 ENCOUNTER — Ambulatory Visit (INDEPENDENT_AMBULATORY_CARE_PROVIDER_SITE_OTHER): Payer: Medicare Other | Admitting: Internal Medicine

## 2022-01-15 VITALS — BP 142/72 | HR 62 | Ht 72.0 in | Wt 201.6 lb

## 2022-01-15 DIAGNOSIS — E785 Hyperlipidemia, unspecified: Secondary | ICD-10-CM

## 2022-01-15 DIAGNOSIS — Z79899 Other long term (current) drug therapy: Secondary | ICD-10-CM

## 2022-01-15 DIAGNOSIS — Z8679 Personal history of other diseases of the circulatory system: Secondary | ICD-10-CM | POA: Diagnosis not present

## 2022-01-15 DIAGNOSIS — Z9889 Other specified postprocedural states: Secondary | ICD-10-CM | POA: Diagnosis not present

## 2022-01-15 DIAGNOSIS — R002 Palpitations: Secondary | ICD-10-CM | POA: Diagnosis not present

## 2022-01-15 DIAGNOSIS — I480 Paroxysmal atrial fibrillation: Secondary | ICD-10-CM | POA: Diagnosis not present

## 2022-01-15 MED ORDER — ATORVASTATIN CALCIUM 80 MG PO TABS: 80.0000 mg | ORAL_TABLET | Freq: Every day | ORAL | 1 refills | Status: DC

## 2022-01-15 NOTE — Patient Instructions (Addendum)
Medication Instructions:  Increase Atorvastatin 80 mg daily   *If you need a refill on your cardiac medications before your next appointment, please call your pharmacy*  Follow-Up: At [facility], you and your health needs are our priority.  As part of our continuing mission to provide you with exceptional heart care, we have created designated Provider Care Teams.  These Care Teams include your primary Cardiologist (physician) and Advanced Practice Providers (APPs -  Physician Assistants and Nurse Practitioners) who all work together to provide you with the care you need, when you need it.  We recommend signing up for the patient portal called "MyChart".  Sign up information is provided on this After Visit Summary.  MyChart is used to connect with patients for Virtual Visits (Telemedicine).  Patients are able to view lab/test results, encounter notes, upcoming appointments, etc.  Non-urgent messages can be sent to your provider as well.   To learn more about what you can do with MyChart, go to [email or web address].    Your next appointment:   6 month(s)  The format for your next appointment:   In Person  Provider:   Elouise Munroe, MD    Other Instructions  Check Blood pressure 3 times weekly  Exercise recommendations: Goal of exercising for at least 30 minutes a day, at least 5 times per week.  Please exercise to a moderate exertion.  This means that while exercising it is difficult to speak in full sentences, however you are not so short of breath that you feel you must stop, and not so comfortable that you can carry on a full conversation.  Exertion level should be approximately a 5/10, if 10 is the most exertion you can perform.  Diet recommendations: Recommend a heart healthy diet such as the Mediterranean diet.  This diet consists of plant based foods, healthy fats, lean meats, olive oil.  It suggests limiting the intake of simple carbohydrates such as white breads,  pastries, and pastas.  It also limits the amount of red meat, wine, and dairy products such as cheese that one should consume on a daily basis.

## 2022-01-15 NOTE — Progress Notes (Incomplete)
Cardiology Office Note:    Date:  01/15/2022   ID:  Kent Bryan, DOB [birth year], MRN [id number]  PCP:  Kristen Loader, FNP  Cardiologist:  Elouise Munroe, MD  Electrophysiologist:  None   Referring MD: Kristen Loader, FNP   Chief Complaint/Reason for Referral: Afib  History of Present Illness:    Kent Bryan is a 71 y.o. male with a history of paroxysmal atrial fibrillation s/p 4 prior ablations, SVT, and hyperlipidemia who presents today for follow up of palpitations and shortness of breath.   Overall feeling well and denies recurrence of atrial fibrillation. Occasional palpitations, not bothersome.   Denies chest pain, shortness of breath, leg swelling, syncope.  This patients CHA2DS2-VASc Score and unadjusted Ischemic Stroke Rate (% per year) is equal to 0.6 % stroke rate/year from a score of 1  Above score calculated as 1 point each if present [CHF, HTN, DM, Vascular=MI/PAD/Aortic Plaque, Age if 65-74, or Male] Above score calculated as 2 points each if present [Age > 75, or Stroke/TIA/TE]    Today,  Whenever he lays on his left side he experience atrial flutters, until he lays on his back then he reports that it normally dissipates. He reports that he experiences this irregular rhythm twice a week, but he adds that he feels like he can control the flutters as of now.  His systolic pressure has went from from 120 to 140.  He does not check his blood pressure, but he does monitor his blood sugar.  As of now he is not as active, but he is working to improve his activity.  Of note, he has been staying on top of his diet, and only eats hotdogs during baseball games.   ***He remains compliant with Lipitor and Azalamine.  The patient denies shortness of breath, nocturnal dyspnea, orthopnea or peripheral edema.  There have been no lightheadedness or syncope. Complains of atrial flutter.   Past Medical History:  Diagnosis Date   Atrial fibrillation ([place])     Hyperlipidemia    Ulcerative proctitis ([place])     Past Surgical History:  Procedure Laterality Date   ATRIAL FIBRILLATION ABLATION     TOTAL HIP ARTHROPLASTY Bilateral     Current Medications: No outpatient medications have been marked as taking for the 01/15/22 encounter (Appointment) with Elouise Munroe, MD.     Allergies:   Methotrexate derivatives   Social History   Tobacco Use   Smoking status: Former   Smokeless tobacco: Never     Family History: The patient's family history includes Heart failure in his mother.  ROS:   Please see the history of present illness.    (+) Atrial Flutters All other systems reviewed and are negative.  EKGs/Labs/Other Studies Reviewed:    The following studies were reviewed today:  EKG:    01/15/2022 EKG: Rate 62. Sinus Rhythm.  06/07/2022 EKG: NSR, sinus arrhythmia.  Recent Labs: No results found for requested labs within last 8760 hours.  Recent Lipid Panel    Component Value Date/Time   CHOL 148 10/20/2019 0826   TRIG 116 10/20/2019 0826   HDL 54 10/20/2019 0826   CHOLHDL 2.7 10/20/2019 0826   LDLCALC 73 10/20/2019 0826    Physical Exam:    VS:  There were no vitals taken for this visit.    Wt Readings from Last 5 Encounters:  06/07/20 205 lb (93 kg)  07/20/19 195 lb (88.5 kg)  01/28/19 192 lb (87.1 kg)  12/26/18 190 lb (  86.2 kg)  12/03/18 191 lb (86.6 kg)    Constitutional: No acute distress Eyes: sclera non-icteric, normal conjunctiva and lids ENMT: normal dentition, moist mucous membranes Cardiovascular: regular rhythm, normal rate, ***1/6 murmur. S1 and S2 normal. Radial pulses normal bilaterally. No jugular venous distention.  Respiratory: clear to auscultation bilaterally GI : normal bowel sounds, soft and nontender. No distention.   MSK: extremities warm, well perfused. No edema.  NEURO: grossly nonfocal exam, moves all extremities. PSYCH: alert and oriented x 3, normal mood and affect.   ASSESSMENT:     No diagnosis found.  PLAN:    Paroxysmal atrial fibrillation ([place]) - Plan: EKG 12-Lead Palpitations - no recurrence of afib after last ablation at [facility] in 2014. Rare palpitations may represent SVT.  Chads2vasc is 1 for age, will monitor with time.   Hyperlipidemia,  - intensified statin therapy at last visit with good result in LDL, now 73. Continue atorvastatin 40 mg daily, will refill today.   Total time of encounter: 20 minutes total time of encounter, including 15 minutes spent in face-to-face patient care on the date of this encounter. This time includes coordination of care and counseling regarding above mentioned problem list. Remainder of non-face-to-face time involved reviewing chart documents/testing relevant to the patient encounter and documentation in the medical record. I have independently reviewed documentation from referring provider.   Cherlynn Kaiser, MD [place]  [facility]    Medication Adjustments/Labs and Tests Ordered: Current medicines are reviewed at length with the patient today.  Concerns regarding medicines are outlined above.   No orders of the defined types were placed in this encounter.   No orders of the defined types were placed in this encounter.   There are no Patient Instructions on file for this visit.   F/U in 6 months  I,Tinashe Williams,acting as a scribe for Elouise Munroe, MD.,have documented all relevant documentation on the behalf of Elouise Munroe, MD,as directed by  Elouise Munroe, MD while in the presence of Elouise Munroe, MD.   ***

## 2022-01-22 ENCOUNTER — Other Ambulatory Visit: Payer: Self-pay | Admitting: Internal Medicine

## 2022-04-19 ENCOUNTER — Telehealth: Payer: Self-pay | Admitting: Internal Medicine

## 2022-04-19 DIAGNOSIS — Z9889 Other specified postprocedural states: Secondary | ICD-10-CM

## 2022-04-19 DIAGNOSIS — I480 Paroxysmal atrial fibrillation: Secondary | ICD-10-CM

## 2022-04-19 NOTE — Telephone Encounter (Signed)
Returned call to patient to discuss the following message. Patient states that he just feels like he has had a gradual change in his heart rate- unable to provide readings. Patient states that overall he feels well, has experienced a couple episodes of lightheadedness upon waking that quickly resolves, and denies any chest pain, shortness of breath, or palpitations. Patient states he just feels like he is at a point where he would like to have his HR monitored with possibly a loop recorder. Patient states that overall he feels okay. Moved patients appointment up to October 16th at 8am. (Patient can only make 8am appointments due to his work schedule)  Spoke with Dr. Margaretann Loveless who recommends patient see Electrophysiology as well.   Spoke with patient and made him aware. Referral has been placed.   Advised patient to keep appointment for October as well. Patient verbalized understanding.   Advised patient to call back to office with any issues, questions, or concerns. Patient verbalized understanding.

## 2022-04-19 NOTE — Telephone Encounter (Signed)
  Per MyChart scheduling message:  Dr. Margaretann Loveless I'm one of those people who are aware of their heart rythms. There is some funny stuff going on, so I made the soonest appointment I could, Jan. 21. What I want is a long term loop monitor installed, so we can get the data we need to figure this out. Basically, it's going too fast when I'm at rest, especially at night pre sleep. I've had 4 ablations. I've experienced A-fib, A-flutter, and know them well. This feels like I'm in a new league.  I need data, as you do, to figure out what's going on. Looking forward to having a good talk about this in January. Best Kent Bryan

## 2022-04-19 NOTE — Addendum Note (Signed)
Addended by: Rexanne Mano B on: 04/19/2022 04:37 PM   Modules accepted: Orders

## 2022-05-22 ENCOUNTER — Encounter: Payer: Self-pay | Admitting: Internal Medicine

## 2022-05-22 ENCOUNTER — Ambulatory Visit: Payer: Managed Care, Other (non HMO) | Attending: Internal Medicine | Admitting: Internal Medicine

## 2022-05-22 DIAGNOSIS — I480 Paroxysmal atrial fibrillation: Secondary | ICD-10-CM | POA: Diagnosis not present

## 2022-05-22 DIAGNOSIS — I4891 Unspecified atrial fibrillation: Secondary | ICD-10-CM | POA: Insufficient documentation

## 2022-05-22 DIAGNOSIS — R002 Palpitations: Secondary | ICD-10-CM | POA: Diagnosis not present

## 2022-05-22 MED ORDER — METOPROLOL SUCCINATE ER 25 MG PO TB24: 12.5000 mg | ORAL_TABLET | Freq: Every day | ORAL | 5 refills | Status: DC

## 2022-05-22 NOTE — Patient Instructions (Addendum)
Medication Instructions:  Your physician has recommended you make the following change in your medication: Start taking- Toprol XL (12.5 Mg)    You will take a HALF tablet ( 12.5 Mg ) by mouth daily for 2 WEEKS.  Increase DOSE on 06/05/2022.  Then, you will take one WHOLE tablet ( 25 Mg ) by mouth daily.    Lab Work: None ordered.  If you have labs (blood work) drawn today and your tests are completely normal, you will receive your results only by: [place] (if you have MyChart) OR A paper copy in the mail If you have any lab test that is abnormal or we need to change your treatment, we will call you to review the results.  Testing/Procedures: None ordered.  Follow-Up: At [facility], you and your health needs are our priority.  As part of our continuing mission to provide you with exceptional heart care, we have created designated Provider Care Teams.  These Care Teams include your primary Cardiologist (physician) and Advanced Practice Providers (APPs -  Physician Assistants and Nurse Practitioners) who all work together to provide you with the care you need, when you need it.  We recommend signing up for the patient portal called "MyChart".  Sign up information is provided on this After Visit Summary.  MyChart is used to connect with patients for Virtual Visits (Telemedicine).  Patients are able to view lab/test results, encounter notes, upcoming appointments, etc.  Non-urgent messages can be sent to your provider as well.   To learn more about what you can do with MyChart, go to [email or web address].    Your next appointment:   Please schedule a follow up appointment with Dr. Lovena Le in 3-4 Months.   The format for your next appointment:   In Person  Provider:   Cristopher Peru, MD{or one of the following Advanced Practice Providers on your designated Care Team:   Tommye Standard, [place] Legrand Como "Jonni Sanger" Chalmers Cater, [place]  Important Information About Sugar      Metoprolol  Extended-Release Tablets What is this medication? METOPROLOL (me TOE proe lole) treats high blood pressure and heart failure. It may also be used to prevent chest pain (angina). It works by lowering your blood pressure and heart rate, making it easier for your heart to pump blood to the rest of your body. It belongs to a group of medications called beta blockers. This medicine may be used for other purposes; ask your health care provider or pharmacist if you have questions. COMMON BRAND NAME(S): toprol, Toprol XL What should I tell my care team before I take this medication? They need to know if you have any of these conditions: Diabetes Heart or vessel disease like slow heart rate, worsening heart failure, heart block, sick sinus syndrome, or Raynaud's disease Kidney disease Liver disease Lung or breathing disease, like asthma or emphysema Pheochromocytoma Thyroid disease An unusual or allergic reaction to metoprolol, other beta blockers, medications, foods, dyes, or preservatives Pregnant or trying to get pregnant Breast-feeding How should I use this medication? Take this medication by mouth. Take it as directed on the prescription label at the same time every day. Take it with food. You may cut the tablet in half if it is scored (has a line in the middle of it). This may help you swallow the tablet if the whole tablet is too big. Be sure to take both halves. Do not take just one-half of the tablet. Keep taking it unless your care team tells you to  stop. Talk to your care team about the use of this medication in children. While it may be prescribed for children as Ilze Roselli as 6 years for selected conditions, precautions do apply. Overdosage: If you think you have taken too much of this medicine contact a poison control center or emergency room at once. NOTE: This medicine is only for you. Do not share this medicine with others. What if I miss a dose? If you miss a dose, take it as soon as you  can. If it is almost time for your next dose, take only that dose. Do not take double or extra doses. What may interact with this medication? This medication may interact with the following: Certain medications for blood pressure, heart disease, irregular heartbeat Certain medications for depression, like monoamine oxidase (MAO) inhibitors, fluoxetine, or paroxetine Clonidine Dobutamine Epinephrine Isoproterenol Reserpine This list may not describe all possible interactions. Give your health care provider a list of all the medicines, herbs, non-prescription drugs, or dietary supplements you use. Also tell them if you smoke, drink alcohol, or use illegal drugs. Some items may interact with your medicine. What should I watch for while using this medication? Visit your care team for regular checks on your progress. Check your blood pressure as directed. Ask your care team what your blood pressure should be. Also, find out when you should contact them. Do not treat yourself for coughs, colds, or pain while you are using this medication without asking your care team for advice. Some medications may increase your blood pressure. You may get drowsy or dizzy. Do not drive, use machinery, or do anything that needs mental alertness until you know how this medication affects you. Do not stand up or sit up quickly, especially if you are an older patient. This reduces the risk of dizzy or fainting spells. Alcohol may interfere with the effect of this medication. Avoid alcoholic drinks. This medication may increase blood sugar. Ask your care team if changes in diet or medications are needed if you have diabetes. What side effects may I notice from receiving this medication? Side effects that you should report to your care team as soon as possible: Allergic reactions--skin rash, itching, hives, swelling of the face, lips, tongue, or throat Heart failure--shortness of breath, swelling of the ankles, feet, or  hands, sudden weight gain, unusual weakness or fatigue Low blood pressure--dizziness, feeling faint or lightheaded, blurry vision Raynaud's--cool, numb, or painful fingers or toes that may change color from pale, to blue, to red Slow heartbeat--dizziness, feeling faint or lightheaded, confusion, trouble breathing, unusual weakness or fatigue Worsening mood, feelings of depression Side effects that usually do not require medical attention (report to your care team if they continue or are bothersome): Change in sex drive or performance Diarrhea Dizziness Fatigue Headache This list may not describe all possible side effects. Call your doctor for medical advice about side effects. You may report side effects to FDA at 1-800-FDA-1088. Where should I keep my medication? Keep out of the reach of children and pets. Store at room temperature between 20 and 25 degrees C (68 and 77 degrees F). Throw away any unused medication after the expiration date. NOTE: This sheet is a summary. It may not cover all possible information. If you have questions about this medicine, talk to your doctor, pharmacist, or health care provider.  2023 Elsevier/Gold Standard (2020-09-23 00:00:00)

## 2022-05-22 NOTE — Progress Notes (Signed)
HPI Mr. Kent Bryan is referred by Dr. Delrae Sawyers for evaluation of atrial fib. He is a pleasant 71 yo man with persistent atrial fib, s/p 4 catheter ablations, the last at [facility] in 2014 by Oris Drone. 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 He has had palpitations and is referred today to discuss ILR insertion. After his last ablation, his doctors told him they had done "all they could" for his atrial fib. He notes that if he lays on his left side, especially at night he will feel his palpitations. They last for minutes.  Allergies  Allergen Reactions   Methotrexate Derivatives Other (See Comments)    Chest pressure.     Current Outpatient Medications  Medication Sig Dispense Refill   acetaminophen (TYLENOL) 500 MG tablet Take 500 mg by mouth every 6 (six) hours as needed.     atorvastatin (LIPITOR) 80 MG tablet Take 1 tablet (80 mg total) by mouth daily. 90 tablet 1   B Complex Vitamins (B COMPLEX 1 PO) B Complex  1 qd     mesalamine (LIALDA) 1.2 g EC tablet Take 1.2 g by mouth 2 (two) times a day.      Multiple Vitamin (MULTI-VITAMIN DAILY PO) Take 1 tablet by mouth 2 (two) times a day.      No current facility-administered medications for this visit.     Past Medical History:  Diagnosis Date   Atrial fibrillation ([place])    Hyperlipidemia    Ulcerative proctitis ([place])     ROS:   All systems reviewed and negative except as noted in the HPI.   Past Surgical History:  Procedure Laterality Date   ATRIAL FIBRILLATION ABLATION     TOTAL HIP ARTHROPLASTY Bilateral      Family History  Problem Relation Age of Onset   Heart failure Mother      Social History   Socioeconomic History   Marital status: Single    Spouse name: Not on file   Number of children: Not on file   Years of education: Not on file   Highest education level: Not on file  Occupational  History   Not on file  Tobacco Use   Smoking status: Former   Smokeless tobacco: Never  Substance and Sexual Activity   Alcohol use: Not on file   Drug use: Not on file   Sexual activity: Not on file  Other Topics Concern   Not on file  Social History Narrative   Not on file   Social Determinants of Health   Financial Resource Strain: Not on file  Food Insecurity: Not on file  Transportation Needs: Not on file  Physical Activity: Not on file  Stress: Not on file  Social Connections: Not on file  Intimate Partner Violence: Not on file     BP 128/68   Pulse 68   Ht 6' (1.829 m)   Wt 191 lb (86.6 kg)   SpO2 97%   BMI 25.90 kg/m   Physical Exam:  Well appearing NAD HEENT: Unremarkable Neck:  No JVD, no thyromegally Lymphatics:  No adenopathy Back:  No CVA tenderness Lungs:  Clear with no wheezes HEART:  Regular rate rhythm, no murmurs, no rubs, no clicks Abd:  soft, positive bowel sounds, no organomegally, no rebound, no guarding Ext:  2 plus pulses, no edema, no cyanosis, no clubbing Skin:  No rashes no nodules Neuro:  CN II through XII intact, motor grossly intact  EKG - nsr  Assess/Plan:  PAF - unclear if he is having atrial fib, flutter or something else. He is not particularly bothered. His CHADSVASC is 1. No indication for systemic anti-coag. In light of his fairly mild symptoms, I do not think placement of an ILR will make much of a difference. Would not at this point recommend any more ablations. Rather is is reasonable to treat his symptoms and I have asked him to start low dose toprol and we will titrate up. His bp was up a little in June but not now. If he develops HTN or when he turns 46, we would reconsider ILR insertion.  Kent Overlie Nylani Michetti,MD

## 2022-05-28 ENCOUNTER — Encounter: Payer: Self-pay | Admitting: Internal Medicine

## 2022-05-28 ENCOUNTER — Ambulatory Visit: Payer: Managed Care, Other (non HMO) | Attending: Internal Medicine | Admitting: Internal Medicine

## 2022-05-28 VITALS — BP 142/68 | HR 67 | Ht 72.0 in | Wt 193.4 lb

## 2022-05-28 DIAGNOSIS — Z79899 Other long term (current) drug therapy: Secondary | ICD-10-CM

## 2022-05-28 DIAGNOSIS — I480 Paroxysmal atrial fibrillation: Secondary | ICD-10-CM | POA: Diagnosis not present

## 2022-05-28 DIAGNOSIS — Z8679 Personal history of other diseases of the circulatory system: Secondary | ICD-10-CM

## 2022-05-28 DIAGNOSIS — Z9889 Other specified postprocedural states: Secondary | ICD-10-CM

## 2022-05-28 DIAGNOSIS — R002 Palpitations: Secondary | ICD-10-CM

## 2022-05-28 DIAGNOSIS — E785 Hyperlipidemia, unspecified: Secondary | ICD-10-CM

## 2022-05-28 DIAGNOSIS — R011 Cardiac murmur, unspecified: Secondary | ICD-10-CM

## 2022-05-28 MED ORDER — ATORVASTATIN CALCIUM 40 MG PO TABS: 40.0000 mg | ORAL_TABLET | Freq: Every day | ORAL | 1 refills | Status: DC

## 2022-05-28 MED ORDER — EZETIMIBE 10 MG PO TABS: 10.0000 mg | ORAL_TABLET | Freq: Every day | ORAL | 3 refills | Status: DC

## 2022-05-28 NOTE — Patient Instructions (Addendum)
Medication Instructions:  DECREASE the Atorvastatin to 40 mg once daily  START Zetia (Ezetimibe)   *If you need a refill on your cardiac medications before your next appointment, please call your pharmacy*   Lab Work: None ordered If you have labs (blood work) drawn today and your tests are completely normal, you will receive your results only by: [place] (if you have MyChart) OR A paper copy in the mail If you have any lab test that is abnormal or we need to change your treatment, we will call you to review the results.   Testing/Procedures: Your physician has requested that you have an echocardiogram in 6 months. Echocardiography is a painless test that uses sound waves to create images of your heart. It provides your doctor with information about the size and shape of your heart and how well your heart's chambers and valves are working. You may receive an ultrasound enhancing agent through an IV if needed to better visualize your heart during the echo.This procedure takes approximately one hour. There are no restrictions for this procedure. This will take place at the [address].     Follow-Up: At [facility], you and your health needs are our priority.  As part of our continuing mission to provide you with exceptional heart care, we have created designated Provider Care Teams.  These Care Teams include your primary Cardiologist (physician) and Advanced Practice Providers (APPs -  Physician Assistants and Nurse Practitioners) who all work together to provide you with the care you need, when you need it.  We recommend signing up for the patient portal called "MyChart".  Sign up information is provided on this After Visit Summary.  MyChart is used to connect with patients for Virtual Visits (Telemedicine).  Patients are able to view lab/test results, encounter notes, upcoming appointments, etc.  Non-urgent messages can be sent to your provider as well.    To learn more about what you can do with MyChart, go to [email or web address].    Your next appointment:   6 month(s) after the echo  The format for your next appointment:   In Person  Provider:   Elouise Munroe, MD     Other Instructions Dr. Margaretann Loveless would like you to check your blood pressure daily with a goal of 140/90 and less.  Keep a journal of these daily blood pressure and heart rate readings and call our office or send a message through [place] with the results. Thank you!  It is best to check your BP 1-2 hours after taking your medications to see the medications effectiveness on your BP.    Here are some tips that our clinical pharmacists share for home BP monitoring:          Rest 10 minutes before taking your blood pressure.          Don't smoke or drink caffeinated beverages for at least 30 minutes before.          Take your blood pressure before (not after) you eat.          Sit comfortably with your back supported and both feet on the floor (don't cross your legs).          Elevate your arm to heart level on a table or a desk.          Use the proper sized cuff. It should fit smoothly and snugly around your bare upper arm. There should be enough room to  slip a fingertip under the cuff. The bottom edge of the cuff should be 1 inch above the crease of the elbow.

## 2022-05-28 NOTE — Progress Notes (Signed)
Cardiology Office Note:    Date:  05/28/2022   ID:  Kent Bryan, DOB Mar 19, 1951, MRN [id number]  PCP:  Kristen Loader, FNP  Cardiologist:  Elouise Munroe, MD  Electrophysiologist:  None   Referring MD: Kristen Loader, FNP   Chief Complaint/Reason for Referral: Afib  History of Present Illness:    Kent Bryan is a 71 y.o. male with a history of paroxysmal atrial fibrillation s/p 4 prior ablations, SVT, and hyperlipidemia who presents today for follow up of palpitations and shortness of breath.   More frequent palpitations lead to EP referral, recently seen by Dr. Lovena Le. Recommended starting low dose Toprol. He is tolerating this well, he feels 12.71m of metoprolol has essentially completely resolved his palpitations.  We discussed not uptitrating the dose since he is feeling well, and we will uptitrate as needed in the future for increased palpitations.  He feels his musculoskeletal aches and pains were worsened by increasing the dose of atorvastatin and he has been holding this for approximately 3 weeks.  We reviewed that we can resume atorvastatin 40 mg daily and add to it ezetimibe 10 mg daily for the benefit of his LDL.  Likely he will have labs with his primary in a few weeks at his upcoming appointment.  He has a soft systolic murmur, we do not have a current echocardiogram.  We will obtain this with next follow-up.  The patient denies chest pain, chest pressure, dyspnea at rest or with exertion, PND, orthopnea, or leg swelling. Denies cough, fever, chills. Denies nausea, vomiting. Denies syncope or presyncope. Denies dizziness or lightheadedness.   Past Medical History:  Diagnosis Date   [place]    Hyperlipidemia    [place]     Past Surgical History:  Procedure Laterality Date   ATRIAL FIBRILLATION ABLATION     TOTAL HIP ARTHROPLASTY Bilateral     Current Medications: Current Meds  Medication Sig   acetaminophen (TYLENOL) 500  MG tablet Take 500 mg by mouth every 6 (six) hours as needed.   B Complex Vitamins (B COMPLEX 1 PO) B Complex  1 qd   ezetimibe (ZETIA) 10 MG tablet Take 1 tablet (10 mg total) by mouth daily.   mesalamine (LIALDA) 1.2 g EC tablet Take 1.2 g by mouth 2 (two) times a day.    metoprolol succinate (TOPROL XL) 25 MG 24 hr tablet Take 0.5 tablets (12.5 mg total) by mouth daily. ( For 2 weeks.) Then: Take one tablet ( 25 mg ) by mouth daily. (Patient taking differently: Take 12.5 mg by mouth daily.)   Multiple Vitamin (MULTI-VITAMIN DAILY PO) Take 1 tablet by mouth 2 (two) times a day.      Allergies:   Methotrexate derivatives   Social History   Tobacco Use   Smoking status: Former   Smokeless tobacco: Never     Family History: The patient's family history includes Heart failure in his mother.  ROS:   Please see the history of present illness.    (+) Atrial Flutters All other systems reviewed and are negative.  EKGs/Labs/Other Studies Reviewed:    The following studies were reviewed today:  EKG:  n/a  01/15/2022 EKG: Rate 62. Sinus Rhythm.  06/07/2022 EKG: NSR, sinus arrhythmia.  Recent Labs: No results found for requested labs within last 365 days.  Recent Lipid Panel    Component Value Date/Time   CHOL 148 10/20/2019 0826   TRIG 116 10/20/2019 0826   HDL 54  10/20/2019 0826   CHOLHDL 2.7 10/20/2019 0826   LDLCALC 73 10/20/2019 0826    Physical Exam:    VS:  BP (!) 142/68 (Cuff Size: Normal)   Pulse 67   Ht 6' (1.829 m)   Wt 193 lb 6.4 oz (87.7 kg)   SpO2 97%   BMI 26.23 kg/m     Wt Readings from Last 5 Encounters:  05/28/22 193 lb 6.4 oz (87.7 kg)  05/22/22 191 lb (86.6 kg)  01/15/22 201 lb 9.6 oz (91.4 kg)  06/07/20 205 lb (93 kg)  07/20/19 195 lb (88.5 kg)    Constitutional: No acute distress Eyes: sclera non-icteric, normal conjunctiva and lids ENMT: normal dentition, moist mucous membranes Cardiovascular: regular rhythm, normal rate, 1/6 systolic  murmur. S1 and S2 normal. Radial pulses normal bilaterally. No jugular venous distention.  Respiratory: clear to auscultation bilaterally GI : normal bowel sounds, soft and nontender. No distention.   MSK: extremities warm, well perfused. No edema.  NEURO: grossly nonfocal exam, moves all extremities. PSYCH: alert and oriented x 3, normal mood and affect.   ASSESSMENT:    1. Paroxysmal atrial fibrillation (HCC)   2. Palpitations   3. S/P ablation of atrial fibrillation x 4   4. Hyperlipidemia, unspecified hyperlipidemia type   5. Medication management   6. Murmur      PLAN:    Paroxysmal atrial fibrillation (HCC) Palpitations - CHADS2VASC 1, no AC. Recently seen by EP, they concur. - recently started Toprol XL, tolerating 12.5 mg daily, will stay at that dose without up titration since he is feeling well. -Mildly elevated blood pressure though it was normal at his visit with Dr. Lovena Le.  May have prehypertension, will monitor this closely as this would increase his CHA2DS2-VASc score to the threshold requiring anticoagulation.  He will obtain a blood pressure log and monitor this closely.  Hyperlipidemia  - LDL 87 on last check. Goal is <70.  We had increased the dose of atorvastatin to 80 mg daily but he did not tolerate this.  Reduced dose to atorvastatin 40 mg daily and will prescribe Zetia 10 mg daily.  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  In 2015 his aortic valve gradient was 6 mmHg.  He also was noted to have mild to moderate aortic valve regurgitation, though I do not hear a definite diastolic murmur today.  We will update his echocardiogram at next follow-up to ensure we are monitoring this closely.  By exam I am not concerned about significant aortic valve stenosis or regurgitation.  Total time of encounter: 30 minutes total time of encounter, including 20 minutes spent in face-to-face patient care on the date of this encounter.  This time includes coordination of care and counseling regarding above mentioned problem list. Remainder of non-face-to-face time involved reviewing chart documents/testing relevant to the patient encounter and documentation in the medical record. I have independently reviewed documentation from referring provider.   Cherlynn Kaiser, MD, [facility]    Medication Adjustments/Labs and Tests Ordered: Current medicines are reviewed at length with the patient today.  Concerns regarding medicines are outlined above.   Orders Placed This Encounter  Procedures   ECHOCARDIOGRAM COMPLETE    Meds ordered this encounter  Medications   atorvastatin (LIPITOR) 40 MG tablet    Sig: Take 1 tablet (40 mg total) by mouth daily.    Dispense:  90 tablet    Refill:  1    No  refill needed-note dose change   ezetimibe (ZETIA) 10 MG tablet    Sig: Take 1 tablet (10 mg total) by mouth daily.    Dispense:  90 tablet    Refill:  3    Patient Instructions  Medication Instructions:  DECREASE the Atorvastatin to 40 mg once daily  START Zetia (Ezetimibe)   *If you need a refill on your cardiac medications before your next appointment, please call your pharmacy*   Lab Work: None ordered If you have labs (blood work) drawn today and your tests are completely normal, you will receive your results only by: [place] (if you have MyChart) OR A paper copy in the mail If you have any lab test that is abnormal or we need to change your treatment, we will call you to review the results.   Testing/Procedures: Your physician has requested that you have an echocardiogram in 6 months. Echocardiography is a painless test that uses sound waves to create images of your heart. It provides your doctor with information about the size and shape of your heart and how well your heart's chambers and valves are working. You may receive an ultrasound enhancing agent through an IV if needed to better  visualize your heart during the echo.This procedure takes approximately one hour. There are no restrictions for this procedure. This will take place at the [address], [address].     Follow-Up: At [facility], you and your health needs are our priority.  As part of our continuing mission to provide you with exceptional heart care, we have created designated Provider Care Teams.  These Care Teams include your primary Cardiologist (physician) and Advanced Practice Providers (APPs -  Physician Assistants and Nurse Practitioners) who all work together to provide you with the care you need, when you need it.  We recommend signing up for the patient portal called "MyChart".  Sign up information is provided on this After Visit Summary.  MyChart is used to connect with patients for Virtual Visits (Telemedicine).  Patients are able to view lab/test results, encounter notes, upcoming appointments, etc.  Non-urgent messages can be sent to your provider as well.   To learn more about what you can do with MyChart, go to [email or web address].    Your next appointment:   6 month(s) after the echo  The format for your next appointment:   In Person  Provider:   Elouise Munroe, MD     Other Instructions Dr. Margaretann Loveless would like you to check your blood pressure daily with a goal of 140/90 and less.  Keep a journal of these daily blood pressure and heart rate readings and call our office or send a message through [place] with the results. Thank you!  It is best to check your BP 1-2 hours after taking your medications to see the medications effectiveness on your BP.    Here are some tips that our clinical pharmacists share for home BP monitoring:          Rest 10 minutes before taking your blood pressure.          Don't smoke or drink caffeinated beverages for at least 30 minutes before.          Take your blood pressure before (not after) you eat.          Sit comfortably with your  back supported and both feet on the floor (don't cross your legs).  Elevate your arm to heart level on a table or a desk.          Use the proper sized cuff. It should fit smoothly and snugly around your bare upper arm. There should be enough room to slip a fingertip under the cuff. The bottom edge of the cuff should be 1 inch above the crease of the elbow.

## 2022-09-10 ENCOUNTER — Encounter (INDEPENDENT_AMBULATORY_CARE_PROVIDER_SITE_OTHER): Payer: Self-pay

## 2022-09-10 ENCOUNTER — Encounter (INDEPENDENT_AMBULATORY_CARE_PROVIDER_SITE_OTHER): Payer: Medicare Other | Admitting: Ophthalmology

## 2022-09-10 ENCOUNTER — Ambulatory Visit: Payer: Medicare Other | Admitting: Internal Medicine

## 2022-09-11 ENCOUNTER — Ambulatory Visit: Payer: Medicare Other | Admitting: Internal Medicine

## 2022-11-26 ENCOUNTER — Other Ambulatory Visit (HOSPITAL_COMMUNITY): Payer: Medicare Other

## 2022-11-26 ENCOUNTER — Ambulatory Visit (HOSPITAL_COMMUNITY): Payer: Managed Care, Other (non HMO) | Attending: Internal Medicine

## 2022-11-26 DIAGNOSIS — I480 Paroxysmal atrial fibrillation: Secondary | ICD-10-CM | POA: Diagnosis present

## 2022-11-26 LAB — ECHOCARDIOGRAM COMPLETE
Area-P 1/2: 3.42 cm2
P 1/2 time: 549 msec
S' Lateral: 2.9 cm

## 2022-11-29 ENCOUNTER — Other Ambulatory Visit: Payer: Self-pay | Admitting: Internal Medicine

## 2022-12-04 ENCOUNTER — Ambulatory Visit: Payer: Managed Care, Other (non HMO) | Attending: Internal Medicine | Admitting: Internal Medicine

## 2022-12-04 ENCOUNTER — Encounter: Payer: Self-pay | Admitting: Internal Medicine

## 2022-12-04 VITALS — BP 160/77 | HR 74 | Ht 72.0 in | Wt 198.6 lb

## 2022-12-04 DIAGNOSIS — Z8679 Personal history of other diseases of the circulatory system: Secondary | ICD-10-CM

## 2022-12-04 DIAGNOSIS — R011 Cardiac murmur, unspecified: Secondary | ICD-10-CM

## 2022-12-04 DIAGNOSIS — I480 Paroxysmal atrial fibrillation: Secondary | ICD-10-CM

## 2022-12-04 DIAGNOSIS — Z9889 Other specified postprocedural states: Secondary | ICD-10-CM

## 2022-12-04 DIAGNOSIS — E785 Hyperlipidemia, unspecified: Secondary | ICD-10-CM | POA: Diagnosis not present

## 2022-12-04 DIAGNOSIS — R002 Palpitations: Secondary | ICD-10-CM | POA: Diagnosis not present

## 2022-12-04 NOTE — Patient Instructions (Addendum)
Medication Instructions:  No Changes In Medications at this time.  *If you need a refill on your cardiac medications before your next appointment, please call your pharmacy*  Lab Work: Please return for FASTING Blood Work AT [facility]. No appointment needed, lab here at the office is open Monday-Friday from 8AM to 4PM and closed daily for lunch from 12:45-1:45.   If you have labs (blood work) drawn today and your tests are completely normal, you will receive your results only by: MyChart Message (if you have MyChart) OR A paper copy in the mail If you have any lab test that is abnormal or we need to change your treatment, we will call you to review the results.  Testing/Procedures: None Ordered At This Time.   Follow-Up: At [facility], you and your health needs are our priority.  As part of our continuing mission to provide you with exceptional heart care, we have created designated Provider Care Teams.  These Care Teams include your primary Cardiologist (physician) and Advanced Practice Providers (APPs -  Physician Assistants and Nurse Practitioners) who all work together to provide you with the care you need, when you need it.  Your next appointment:   6 month(s)  Provider:   Parke Poisson, MD     PLEASE MONITOR BLOOD PRESSURE ONCE DAILY- WRITE THESE NUMBERS DOWN AND BRING TO YOUR NEXT APPOINTMENT

## 2022-12-04 NOTE — Progress Notes (Signed)
Cardiology Office Note:    Date:  12/04/2022   ID:  Kent Bryan, DOB 1951-02-04, MRN [id number]  PCP:  Soundra Pilon, FNP  Cardiologist:  Parke Poisson, MD  Electrophysiologist:  None   Referring MD: Soundra Pilon, FNP   Chief Complaint/Reason for Referral: Afib  History of Present Illness:    Kent Bryan is a 72 y.o. male with a history of paroxysmal atrial fibrillation s/p 4 prior ablations, SVT, and hyperlipidemia who presents today for follow up of palpitations and shortness of breath.   12/04/22: Echo reviewed, mild to mild-mod AI. Stopped metoprolol due to not feeling he needed it. Stopped due to feeling "sedated" after a few weeks of starting.  Atorva 40 mg going well, 2-3 times a week will take 80 mg.  The patient denies chest pain, chest pressure, dyspnea at rest or with exertion, palpitations, PND, orthopnea, or leg swelling. Denies cough, fever, chills. Denies nausea, vomiting. Denies syncope or presyncope. Denies dizziness or lightheadedness. Denies snoring.  Prior visits: More frequent palpitations lead to EP referral, recently seen by Dr. Ladona Ridgel. Recommended starting low dose Toprol. He is tolerating this well, he feels 12.5mg  of metoprolol has essentially completely resolved his palpitations.  We discussed not uptitrating the dose since he is feeling well, and we will uptitrate as needed in the future for increased palpitations.  He feels his musculoskeletal aches and pains were worsened by increasing the dose of atorvastatin and he has been holding this for approximately 3 weeks.  We reviewed that we can resume atorvastatin 40 mg daily and add to it ezetimibe 10 mg daily for the benefit of his LDL.  Likely he will have labs with his primary in a few weeks at his upcoming appointment.  The patient denies chest pain, chest pressure, dyspnea at rest or with exertion, PND, orthopnea, or leg swelling. Denies cough, fever, chills. Denies nausea, vomiting. Denies syncope  or presyncope. Denies dizziness or lightheadedness.   Past Medical History:  Diagnosis Date   Atrial fibrillation    Hyperlipidemia    Ulcerative proctitis     Past Surgical History:  Procedure Laterality Date   ATRIAL FIBRILLATION ABLATION     TOTAL HIP ARTHROPLASTY Bilateral     Current Medications: Current Meds  Medication Sig   atorvastatin (LIPITOR) 40 MG tablet Take 1 tablet (40 mg total) by mouth daily.   B Complex Vitamins (B COMPLEX 1 PO) B Complex  1 qd   ibuprofen (ADVIL) 200 MG tablet Take 200 mg by mouth every 6 (six) hours as needed.   mesalamine (LIALDA) 1.2 g EC tablet Take 1.2 g by mouth 2 (two) times a day.    Multiple Vitamin (MULTI-VITAMIN DAILY PO) Take 1 tablet by mouth 2 (two) times a day.      Allergies:   Methotrexate derivatives   Social History   Tobacco Use   Smoking status: Former   Smokeless tobacco: Never     Family History: The patient's family history includes Heart failure in his mother.  ROS:   Please see the history of present illness.    All other systems reviewed and are negative.  EKGs/Labs/Other Studies Reviewed:    The following studies were reviewed today:  EKG:   12/04/22: SR  01/15/2022 EKG: Rate 62. Sinus Rhythm.  06/07/2022 EKG: NSR, sinus arrhythmia.  Recent Labs: No results found for requested labs within last 365 days.  Recent Lipid Panel    Component Value Date/Time   CHOL 148  10/20/2019 0826   TRIG 116 10/20/2019 0826   HDL 54 10/20/2019 0826   CHOLHDL 2.7 10/20/2019 0826   LDLCALC 73 10/20/2019 0826    Physical Exam:    VS:  BP (!) 160/77   Pulse 74   Ht 6' (1.829 m)   Wt 198 lb 9.6 oz (90.1 kg)   SpO2 99%   BMI 26.94 kg/m     Wt Readings from Last 5 Encounters:  12/04/22 198 lb 9.6 oz (90.1 kg)  05/28/22 193 lb 6.4 oz (87.7 kg)  05/22/22 191 lb (86.6 kg)  01/15/22 201 lb 9.6 oz (91.4 kg)  06/07/20 205 lb (93 kg)    Constitutional: No acute distress Eyes: sclera non-icteric, normal  conjunctiva and lids ENMT: normal dentition, moist mucous membranes Cardiovascular: regular rhythm, normal rate, 1/6 systolic murmur, faint diastolic murmur left sternal border. S1 and S2 normal. Radial pulses normal bilaterally. No jugular venous distention.  Respiratory: clear to auscultation bilaterally GI : normal bowel sounds, soft and nontender. No distention.   MSK: extremities warm, well perfused. No edema.  NEURO: grossly nonfocal exam, moves all extremities. PSYCH: alert and oriented x 3, normal mood and affect.   ASSESSMENT:    1. Paroxysmal atrial fibrillation   2. Palpitations   3. S/P ablation of atrial fibrillation x 4   4. Hyperlipidemia, unspecified hyperlipidemia type   5. Murmur    PLAN:    Paroxysmal atrial fibrillation (HCC) Palpitations Status post ablation of atrial fibrillation x 4 - CHADS2VASC 1, no AC. Seen by EP, they concur. - recently started Toprol XL, but he did not tolerate this due to feeling sedated and stopped several weeks after starting.  He is relatively asymptomatic, reasonable to observe. -Mildly elevated blood pressure though it was normal at his visit with Dr. Ladona Ridgel, BP elevated over serial visits.  Patient notes that when he is relaxed at home his blood pressure is in normal range.  May have prehypertension, will monitor this closely as this would increase his CHA2DS2-VASc score to the threshold requiring anticoagulation.  He will obtain a blood pressure log and monitor this closely.  Instructed to bring this log to his next appointment  Hyperlipidemia  - LDL 87 on last check. Goal is <70.  We had increased the dose of atorvastatin to 80 mg daily but he did not tolerate this.  Reduced dose to atorvastatin 40 mg daily.  About 2-3 times a week he will take an additional 40 mg tab for a total of 80 mg daily.  He stopped Zetia due to not tolerating it well, vague but uncomfortable symptoms. -Obtain lipid panel, LFTs, LP(a) next time he is  fasting.  Murmur - echo independently reviewed, aortic valve sclerosis, mild to moderate aortic valve regurgitation.  1/6 systolic murmur and faint diastolic murmur.  Can observe, echo can be repeated in 1 to 2 years given apparent stability of mild to moderate aortic valve regurgitation.  Total time of encounter: 30 minutes total time of encounter, including 20 minutes spent in face-to-face patient care on the date of this encounter. This time includes coordination of care and counseling regarding above mentioned problem list. Remainder of non-face-to-face time involved reviewing chart documents/testing relevant to the patient encounter and documentation in the medical record. I have independently reviewed documentation from referring provider.   Weston Brass, MD, [facility]  [facility]    Medication Adjustments/Labs and Tests Ordered: Current medicines are reviewed at length with the patient today.  Concerns  regarding medicines are outlined above.   Orders Placed This Encounter  Procedures   Lipid panel   Lipoprotein A (LPA)   Hepatic function panel   EKG 12-Lead    No orders of the defined types were placed in this encounter.   Patient Instructions  Medication Instructions:  No Changes In Medications at this time.  *If you need a refill on your cardiac medications before your next appointment, please call your pharmacy*  Lab Work: Please return for FASTING Blood Work AT [facility]. No appointment needed, lab here at the office is open Monday-Friday from 8AM to 4PM and closed daily for lunch from 12:45-1:45.   If you have labs (blood work) drawn today and your tests are completely normal, you will receive your results only by: MyChart Message (if you have MyChart) OR A paper copy in the mail If you have any lab test that is abnormal or we need to change your treatment, we will call you to review the results.  Testing/Procedures: None Ordered At This Time.    Follow-Up: At [facility], you and your health needs are our priority.  As part of our continuing mission to provide you with exceptional heart care, we have created designated Provider Care Teams.  These Care Teams include your primary Cardiologist (physician) and Advanced Practice Providers (APPs -  Physician Assistants and Nurse Practitioners) who all work together to provide you with the care you need, when you need it.  Your next appointment:   6 month(s)  Provider:   Parke Poisson, MD     PLEASE MONITOR BLOOD PRESSURE ONCE DAILY- WRITE THESE NUMBERS DOWN AND BRING TO YOUR NEXT APPOINTMENT

## 2022-12-13 LAB — HEPATIC FUNCTION PANEL
ALT: 29 IU/L (ref 0–44)
AST: 28 IU/L (ref 0–40)
Albumin: 4.4 g/dL (ref 3.8–4.8)
Alkaline Phosphatase: 65 IU/L (ref 44–121)
Bilirubin Total: 0.5 mg/dL (ref 0.0–1.2)
Bilirubin, Direct: 0.18 mg/dL (ref 0.00–0.40)
Total Protein: 6.6 g/dL (ref 6.0–8.5)

## 2022-12-13 LAB — LIPID PANEL
Chol/HDL Ratio: 2.8 ratio (ref 0.0–5.0)
Cholesterol, Total: 128 mg/dL (ref 100–199)
HDL: 46 mg/dL (ref >39)
LDL Chol Calc (NIH): 63 mg/dL (ref 0–99)
Triglycerides: 105 mg/dL (ref 0–149)
VLDL Cholesterol Cal: 19 mg/dL (ref 5–40)

## 2022-12-13 LAB — LIPOPROTEIN A (LPA): Lipoprotein (a): 25.1 nmol/L (ref <75.0)

## 2022-12-20 ENCOUNTER — Ambulatory Visit: Payer: Managed Care, Other (non HMO) | Attending: Internal Medicine | Admitting: Internal Medicine

## 2022-12-20 ENCOUNTER — Encounter: Payer: Self-pay | Admitting: Internal Medicine

## 2022-12-20 VITALS — BP 130/62 | HR 64 | Ht 72.0 in | Wt 197.0 lb

## 2022-12-20 DIAGNOSIS — I4891 Unspecified atrial fibrillation: Secondary | ICD-10-CM | POA: Diagnosis not present

## 2022-12-20 DIAGNOSIS — R002 Palpitations: Secondary | ICD-10-CM

## 2022-12-20 NOTE — Progress Notes (Signed)
HPI Kent Bryan is referred by Dr. Hedda Bryan for evaluation of atrial fib. He is a pleasant 72 yo man with persistent atrial fib, s/p 4 catheter ablations, the last at [facility] clinic in 2014 by Kent Bryan. 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 When I saw him last we tried low dose metoprolol but it made him feel "foggy" and he stopped it. His palpitations have been controlled. A 2D echo last month demonstrated moderate AI and PI. Preserved LV function Allergies  Allergen Reactions   Methotrexate Derivatives Other (See Comments)    Chest pressure.     Current Outpatient Medications  Medication Sig Dispense Refill   atorvastatin (LIPITOR) 40 MG tablet Take 1 tablet (40 mg total) by mouth daily. 90 tablet 1   B Complex Vitamins (B COMPLEX 1 PO) B Complex  1 qd     ibuprofen (ADVIL) 200 MG tablet Take 200 mg by mouth every 6 (six) hours as needed.     mesalamine (LIALDA) 1.2 g EC tablet Take 1.2 g by mouth 2 (two) times a day.      Multiple Vitamin (MULTI-VITAMIN DAILY PO) Take 1 tablet by mouth 2 (two) times a day.      No current facility-administered medications for this visit.     Past Medical History:  Diagnosis Date   Atrial fibrillation (HCC)    Hyperlipidemia    Ulcerative proctitis (HCC)     ROS:   All systems reviewed and negative except as noted in the HPI.   Past Surgical History:  Procedure Laterality Date   ATRIAL FIBRILLATION ABLATION     TOTAL HIP ARTHROPLASTY Bilateral      Family History  Problem Relation Age of Onset   Heart failure Mother      Social History   Socioeconomic History   Marital status: Single    Spouse name: Not on file   Number of children: Not on file   Years of education: Not on file   Highest education level: Not on file  Occupational History   Not on file  Tobacco Use   Smoking status: Former   Smokeless tobacco:  Never  Substance and Sexual Activity   Alcohol use: Not on file   Drug use: Not on file   Sexual activity: Not on file  Other Topics Concern   Not on file  Social History Narrative   Not on file   Social Determinants of Health   Financial Resource Strain: Not on file  Food Insecurity: Not on file  Transportation Needs: Not on file  Physical Activity: Not on file  Stress: Not on file  Social Connections: Not on file  Intimate Partner Violence: Not on file     BP 130/62   Pulse 64   Ht 6' (1.829 m)   Wt 197 lb (89.4 kg)   SpO2 96%   BMI 26.72 kg/m   Physical Exam:  Well appearing NAD HEENT: Unremarkable Neck:  No JVD, no thyromegally Lymphatics:  No adenopathy Back:  No CVA tenderness Lungs:  Clear with no wheezes HEART:  Regular rate rhythm, no murmurs, no rubs, no clicks Abd:  soft, positive bowel sounds, no organomegally, no rebound, no guarding Ext:  2 plus pulses, no edema, no cyanosis, no clubbing Skin:  No rashes no nodules Neuro:  CN II through XII intact, motor grossly intact  Assess/Plan:  PAF - He is doing well and is mostly asymptomatic. I recommended an OAC when her turns 74 or if he develops HTN.  AI - on exam I cannot hear it and his pulse pressure is not widened and he is not symptomatic. Watchful waiting.  Kent Gowda Jessel Gettinger,MD

## 2022-12-20 NOTE — Patient Instructions (Addendum)
Medication Instructions:  Your physician recommends that you continue on your current medications as directed. Please refer to the Current Medication list given to you today.  *If you need a refill on your cardiac medications before your next appointment, please call your pharmacy*  Lab Work: None ordered.  If you have labs (blood work) drawn today and your tests are completely normal, you will receive your results only by: MyChart Message (if you have MyChart) OR A paper copy in the mail If you have any lab test that is abnormal or we need to change your treatment, we will call you to review the results.  Testing/Procedures: None ordered.  Follow-Up: At [facility], you and your health needs are our priority.  As part of our continuing mission to provide you with exceptional heart care, we have created designated Provider Care Teams.  These Care Teams include your primary Cardiologist (physician) and Advanced Practice Providers (APPs -  Physician Assistants and Nurse Practitioners) who all work together to provide you with the care you need, when you need it.   Your next appointment:   1 year(s)  The format for your next appointment:   In Person  Provider:   Gregg Taylor, MD{or one of the following Advanced Practice Providers on your designated Care Team:   Renee Ursuy, PA-C Michael "Andy" Tillery, PA-C           

## 2023-01-14 ENCOUNTER — Other Ambulatory Visit: Payer: Self-pay

## 2023-01-14 MED ORDER — ATORVASTATIN CALCIUM 40 MG PO TABS: 40.0000 mg | ORAL_TABLET | Freq: Every day | ORAL | 3 refills | Status: DC

## 2023-02-26 LAB — LAB REPORT - SCANNED: A1c: 6.3

## 2023-06-12 ENCOUNTER — Ambulatory Visit: Payer: Managed Care, Other (non HMO) | Admitting: Internal Medicine

## 2023-06-20 ENCOUNTER — Ambulatory Visit: Payer: Managed Care, Other (non HMO) | Attending: Internal Medicine | Admitting: Internal Medicine

## 2023-06-20 ENCOUNTER — Other Ambulatory Visit (INDEPENDENT_AMBULATORY_CARE_PROVIDER_SITE_OTHER): Payer: Managed Care, Other (non HMO) | Admitting: *Deleted

## 2023-06-20 VITALS — BP 132/78 | HR 68 | Wt 198.4 lb

## 2023-06-20 DIAGNOSIS — Z79899 Other long term (current) drug therapy: Secondary | ICD-10-CM

## 2023-06-20 DIAGNOSIS — I4891 Unspecified atrial fibrillation: Secondary | ICD-10-CM

## 2023-06-20 DIAGNOSIS — I480 Paroxysmal atrial fibrillation: Secondary | ICD-10-CM | POA: Diagnosis not present

## 2023-06-20 DIAGNOSIS — Z9889 Other specified postprocedural states: Secondary | ICD-10-CM

## 2023-06-20 DIAGNOSIS — R002 Palpitations: Secondary | ICD-10-CM

## 2023-06-20 DIAGNOSIS — E785 Hyperlipidemia, unspecified: Secondary | ICD-10-CM | POA: Diagnosis not present

## 2023-06-20 DIAGNOSIS — Z8679 Personal history of other diseases of the circulatory system: Secondary | ICD-10-CM

## 2023-06-20 DIAGNOSIS — R011 Cardiac murmur, unspecified: Secondary | ICD-10-CM

## 2023-06-20 MED ORDER — AMLODIPINE BESYLATE 5 MG PO TABS: 5.0000 mg | ORAL_TABLET | Freq: Every day | ORAL | 3 refills | Status: DC

## 2023-06-20 NOTE — Patient Instructions (Addendum)
Medication Instructions:  Start: Amlodipine (Norvasc) 5 mg Once Daily *If you need a refill on your cardiac medications before your next appointment, please call your pharmacy*  Lab Work: None  Testing/Procedures: None  Follow-Up: At [facility], you and your health needs are our priority.  As part of our continuing mission to provide you with exceptional heart care, we have created designated Provider Care Teams.  These Care Teams include your primary Cardiologist (physician) and Advanced Practice Providers (APPs -  Physician Assistants and Nurse Practitioners) who all work together to provide you with the care you need, when you need it.  Your next appointment:   4 month(s)  Provider:   Parke Poisson, MD

## 2023-06-20 NOTE — Progress Notes (Signed)
Cardiology Office Note:    Date:  06/20/2023   ID:  Kent Bryan, DOB 1951/03/17, MRN [id number]  PCP:  Soundra Pilon, FNP  Cardiologist:  Parke Poisson, MD  Electrophysiologist:  None   Referring MD: Soundra Pilon, FNP   Chief Complaint/Reason for Referral: Afib  History of Present Illness:    Kent Bryan is a 72 y.o. male with a history of paroxysmal atrial fibrillation s/p 4 prior ablations, SVT, and hyperlipidemia who presents today for follow up of palpitations and shortness of breath.   06/20/23: Feeling well overall with stable minimal nighttime palpitations.  Had previously taken metoprolol when he had had historic atrial fibrillation, did not feel excellent on this medicine.  Has seen EP who also recommend observation.  No chest pain or shortness of breath.  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  Blood pressures have been more elevated lately with averages rising into the 140s.  He stopped eating salty chips and blood pressure improved slightly but still remains stage I hypertension.  We discussed medication therapy today.  LDL is slightly above goal it is 83, it had improved on medical therapy but he did not tolerate Zetia or increase dose atorvastatin.  Hemoglobin A1c is in the prediabetic range, triglycerides are 230 though these labs were taken nonfasting so triglycerides may be inaccurate.  I have recommended that when he meets his new primary care provider he obtain his labs fasting.  LP(a) 25 suggesting overall low risk.  He is currently getting over "walking pneumonia" and lungs are clear.   12/04/22: Echo reviewed, mild to mild-mod AI. Stopped metoprolol due to not feeling he needed it. Stopped due to feeling "sedated" after a few weeks of starting.  Atorva 40 mg going well, 2-3 times a week will take 80 mg.  The patient denies chest pain, chest pressure, dyspnea at rest or with exertion, palpitations, PND,  orthopnea, or leg swelling. Denies cough, fever, chills. Denies nausea, vomiting. Denies syncope or presyncope. Denies dizziness or lightheadedness. Denies snoring.  Prior visits: More frequent palpitations lead to EP referral, recently seen by Dr. Ladona Ridgel. Recommended starting low dose Toprol. He is tolerating this well, he feels 12.5mg  of metoprolol has essentially completely resolved his palpitations.  We discussed not uptitrating the dose since he is feeling well, and we will uptitrate as needed in the future for increased palpitations.  He feels his musculoskeletal aches and pains were worsened by increasing the dose of atorvastatin and he has been holding this for approximately 3 weeks.  We reviewed that we can resume atorvastatin 40 mg daily and add to it ezetimibe 10 mg daily for the benefit of his LDL.  Likely he will have labs with his primary in a few weeks at his upcoming appointment.  The patient denies chest pain, chest pressure, dyspnea at rest or with exertion, PND, orthopnea, or leg swelling. Denies cough, fever, chills. Denies nausea, vomiting. Denies syncope or presyncope. Denies dizziness or lightheadedness.   Past Medical History:  Diagnosis Date   Atrial fibrillation (HCC)    Hyperlipidemia    Ulcerative proctitis (HCC)     Past Surgical History:  Procedure Laterality Date   ATRIAL FIBRILLATION ABLATION     TOTAL HIP ARTHROPLASTY Bilateral     Current Medications: Current Meds  Medication Sig   amLODipine (NORVASC) 5 MG tablet Take 1 tablet (5 mg total) by mouth daily.   atorvastatin (LIPITOR) 40  MG tablet Take 1 tablet (40 mg total) by mouth daily.   B Complex Vitamins (B COMPLEX 1 PO) B Complex  1 qd   ibuprofen (ADVIL) 200 MG tablet Take 200 mg by mouth every 6 (six) hours as needed.   magnesium 30 MG tablet Take 700 mg by mouth 2 (two) times daily. For Eczema   mesalamine (LIALDA) 1.2 g EC tablet Take 1.2 g by mouth 2 (two) times a day.    Multiple Vitamin  (MULTI-VITAMIN DAILY PO) Take 1 tablet by mouth 2 (two) times a day.      Allergies:   Methotrexate derivatives   Social History   Tobacco Use   Smoking status: Former   Smokeless tobacco: Never     Family History: The patient's family history includes Heart failure in his mother.  ROS:   Please see the history of present illness.    All other systems reviewed and are negative.  EKGs/Labs/Other Studies Reviewed:    The following studies were reviewed today:  EKG:  06/20/23: NSR   12/04/22: SR  01/15/2022 EKG: Rate 62. Sinus Rhythm.  06/07/2022 EKG: NSR, sinus arrhythmia.  Recent Labs: 12/12/2022: ALT 29  Recent Lipid Panel    Component Value Date/Time   CHOL 128 12/12/2022 0816   TRIG 105 12/12/2022 0816   HDL 46 12/12/2022 0816   CHOLHDL 2.8 12/12/2022 0816   LDLCALC 63 12/12/2022 0816    Physical Exam:    VS:  BP 132/78 (BP Location: Left Arm, Patient Position: Sitting)   Pulse 68   Wt 198 lb 6.4 oz (90 kg)   SpO2 97%   BMI 26.91 kg/m     Wt Readings from Last 5 Encounters:  06/20/23 198 lb 6.4 oz (90 kg)  12/20/22 197 lb (89.4 kg)  12/04/22 198 lb 9.6 oz (90.1 kg)  05/28/22 193 lb 6.4 oz (87.7 kg)  05/22/22 191 lb (86.6 kg)   GEN: No acute distress.   Neck: No JVD Cardiac: RRR, no murmurs, rubs, or gallops.  Respiratory: Clear to auscultation bilaterally. GI: Soft, nontender, non-distended  MS: No edema; No deformity. Neuro:  Nonfocal  Psych: Normal affect    ASSESSMENT:    1. Paroxysmal atrial fibrillation (HCC)   2. Palpitations   3. S/P ablation of atrial fibrillation x 4   4. Hyperlipidemia, unspecified hyperlipidemia type   5. Murmur   6. Medication management     PLAN:    HTN Medication management - start amlodipine 5 mg daily return in 3-4 mo.  Paroxysmal atrial fibrillation (HCC) Palpitations Status post ablation of atrial fibrillation x 4 - CHADS2VASC 2, discussed AC but deferred for now.  - recently started Toprol XL,  but he did not tolerate this due to feeling sedated and stopped several weeks after starting.  He is relatively asymptomatic, reasonable to observe.  Hyperlipidemia  - LDL 83 on last check. Goal is <70.  We had increased the dose of atorvastatin to 80 mg daily but he did not tolerate this.  Reduced dose to atorvastatin 40 mg daily.  About 2-3 times a week he will take an additional 40 mg tab for a total of 80 mg daily.  He stopped Zetia due to not tolerating it well, vague but uncomfortable symptoms. -Obtain lipid panel with pcp next visit.   Murmur - echo independently reviewed, aortic valve sclerosis, mild to moderate aortic valve regurgitation.  1/6 systolic murmur and faint diastolic murmur.  Can observe, echo can be repeated in  1 to 2 years given apparent stability of mild to moderate aortic valve regurgitation.  Total time of encounter: 30 minutes total time of encounter, including 20 minutes spent in face-to-face patient care on the date of this encounter. This time includes coordination of care and counseling regarding above mentioned problem list. Remainder of non-face-to-face time involved reviewing chart documents/testing relevant to the patient encounter and documentation in the medical record. I have independently reviewed documentation from referring provider.   Kent Brass, MD, [facility]   [facility]    Medication Adjustments/Labs and Tests Ordered: Current medicines are reviewed at length with the patient today.  Concerns regarding medicines are outlined above.   No orders of the defined types were placed in this encounter.   Meds ordered this encounter  Medications   amLODipine (NORVASC) 5 MG tablet    Sig: Take 1 tablet (5 mg total) by mouth daily.    Dispense:  90 tablet    Refill:  3    Patient Instructions  Medication Instructions:  Start: Amlodipine (Norvasc) 5 mg Once Daily *If you need a refill on your cardiac medications before your next  appointment, please call your pharmacy*  Lab Work: None  Testing/Procedures: None  Follow-Up: At [facility], you and your health needs are our priority.  As part of our continuing mission to provide you with exceptional heart care, we have created designated Provider Care Teams.  These Care Teams include your primary Cardiologist (physician) and Advanced Practice Providers (APPs -  Physician Assistants and Nurse Practitioners) who all work together to provide you with the care you need, when you need it.  Your next appointment:   4 month(s)  Provider:   Parke Poisson, MD

## 2023-07-02 LAB — LAB REPORT - SCANNED
A1c: 6.1
EGFR: 94

## 2023-10-08 LAB — LAB REPORT - SCANNED: EGFR: 23

## 2023-10-10 ENCOUNTER — Ambulatory Visit: Payer: Managed Care, Other (non HMO) | Admitting: Internal Medicine

## 2023-12-01 NOTE — Progress Notes (Signed)
 Subjective Kent Bryan is a 73 y.o. male who presents for Mass History of Present Illness The patient is a 73 year old male who presents for evaluation of a spot on his back.  He first observed the spot last night in the mirror, although he had been aware of it for several days prior. The condition appears to have deteriorated since last night, with the spot becoming more pronounced after sleeping on it. He has no known allergies and has not undergone any similar procedures in the past. He is currently under the care of a dermatologist.  ALLERGIES The patient has no known allergies.  Review of Systems  Objective  Blood pressure (!) 151/82, pulse 67, temperature 97.7 F (36.5 C), temperature source Oral, resp. rate 18, height 6' (1.829 m), weight 198 lb (89.8 kg), SpO2 97%. Physical Exam There is a 1 cm circular fluctuant mass just below the right scapula with some mild overlying erythema.  Results     Assessment & Plan 1. Infected sebaceous cyst. The clinical presentation suggests a sebaceous cyst that has become secondarily infected. The area was prepped with Hibiclens and anesthetized with 1% lidocaine with epinephrine. The cyst was excised using an 11 blade scalpel, yielding a moderate amount of purulent material upon expression. A sterile dressing was then applied to the wound. He was advised to maintain a dressing over the wound until it has completely healed. A prescription for doxycycline will be sent to his pharmacy. He was informed that once the infection resolves, the cyst will persist. For complete removal of the cyst, follow up with his dermatologist was suggested.  PROCEDURE The area was prepped with Hibiclens and anesthetized with 1% lidocaine with epinephrine. The cyst was excised using an 11 blade scalpel, yielding a moderate amount of purulent material upon expression. A sterile dressing was then applied to the wound.     Attestation Teacher, early years/pre was used  in creating this visit note. Verbal consent from the patient/caregiver was obtained prior to its use.

## 2023-12-31 ENCOUNTER — Ambulatory Visit: Payer: Managed Care, Other (non HMO) | Admitting: Internal Medicine

## 2024-01-15 ENCOUNTER — Other Ambulatory Visit: Payer: Self-pay | Admitting: Internal Medicine

## 2024-01-21 ENCOUNTER — Other Ambulatory Visit: Payer: Self-pay | Admitting: *Deleted

## 2024-01-21 MED ORDER — ATORVASTATIN CALCIUM 40 MG PO TABS: 40.0000 mg | ORAL_TABLET | Freq: Every day | ORAL | 0 refills | Status: DC

## 2024-03-13 LAB — LAB REPORT - SCANNED
A1c: 5.9
EGFR: 91

## 2024-03-16 ENCOUNTER — Ambulatory Visit: Attending: Internal Medicine | Admitting: Internal Medicine

## 2024-03-16 VITALS — BP 140/62 | HR 68 | Ht 72.0 in | Wt 200.8 lb

## 2024-03-16 DIAGNOSIS — Z8679 Personal history of other diseases of the circulatory system: Secondary | ICD-10-CM

## 2024-03-16 DIAGNOSIS — R011 Cardiac murmur, unspecified: Secondary | ICD-10-CM | POA: Diagnosis not present

## 2024-03-16 DIAGNOSIS — I480 Paroxysmal atrial fibrillation: Secondary | ICD-10-CM

## 2024-03-16 DIAGNOSIS — Z9889 Other specified postprocedural states: Secondary | ICD-10-CM

## 2024-03-16 DIAGNOSIS — E785 Hyperlipidemia, unspecified: Secondary | ICD-10-CM | POA: Diagnosis not present

## 2024-03-16 DIAGNOSIS — R7303 Prediabetes: Secondary | ICD-10-CM

## 2024-03-16 DIAGNOSIS — I351 Nonrheumatic aortic (valve) insufficiency: Secondary | ICD-10-CM

## 2024-03-16 NOTE — Patient Instructions (Signed)
 Medication Instructions:  No changes *If you need a refill on your cardiac medications before your next appointment, please call your pharmacy*  Lab Work: Please have your PCP's office fax us  the recent labs to [phone]  Testing/Procedures: Your physician has requested that you have an echocardiogram in April 2026, before your next follow up visit with Dr. Loni. Echocardiography is a painless test that uses sound waves to create images of your heart. It provides your doctor with information about the size and shape of your heart and how well your heart's chambers and valves are working. This procedure takes approximately one hour. There are no restrictions for this procedure. Please do NOT wear cologne, perfume, aftershave, or lotions (deodorant is allowed). Please arrive 15 minutes prior to your appointment time.  Please note: We ask at that you not bring children with you during ultrasound (echo/ vascular) testing. Due to room size and safety concerns, children are not allowed in the ultrasound rooms during exams. Our front office staff cannot provide observation of children in our lobby area while testing is being conducted. An adult accompanying a patient to their appointment will only be allowed in the ultrasound room at the discretion of the ultrasound technician under special circumstances. We apologize for any inconvenience.   Follow-Up: At [facility], you and your health needs are our priority.  As part of our continuing mission to provide you with exceptional heart care, our providers are all part of one team.  This team includes your primary Cardiologist (physician) and Advanced Practice Providers or APPs (Physician Assistants and Nurse Practitioners) who all work together to provide you with the care you need, when you need it.  Your next appointment:    April 2026 (after Echocardiogram)  Provider:   Gayatri A Acharya, MD   Other Instructions Please continue to  check your blood pressures at home. Contact us  if they are running consistently higher than 140/90.   HOW TO TAKE YOUR BLOOD PRESSURE: Rest 10-15 minutes before taking your blood pressure. Don't smoke or drink caffeinated beverages for at least 30 minutes before. Take your blood pressure before (not after) you eat. Take your BP 1-2 hours after any BP meds. Ensure your bladder is empty. Sit comfortably with your back supported and both feet on the floor (don't cross your legs). Elevate your arm to heart level on a table or a desk. Use the proper sized cuff. It should fit smoothly and snugly around your bare upper arm. There should be enough room to slip a fingertip under the cuff. The bottom edge of the cuff should be 1 inch above the crease of the elbow. Ideally, take 3 measurements at one sitting and record the average.

## 2024-03-16 NOTE — Progress Notes (Signed)
 Cardiology Office Note:  .   Date:  03/16/2024  ID:  Kent Bryan, DOB [birth year], MRN [id number] PCP: Kent Prentice SAUNDERS, FNP  [facility] Providers Cardiologist:  Soyla DELENA Merck, MD    History of Present Illness: Kent Bryan is a 73 y.o. male.  Discussed the use of AI scribe software for clinical note transcription with the patient, who gave verbal consent to proceed.  History of Present Illness Kent Bryan is a 73 year old male with hypertension and aortic valve regurgitation who presents for follow-up of his cardiovascular conditions.  His morning blood pressure readings are 140-150 mmHg, decreasing to the low 130s after relaxation. He has stopped regular monitoring due to fatigue. He takes amlodipine  5 mg daily, but did try a higher doses, which cause discomfort. No dizziness or hypotension symptoms are present usually.  Aortic valve regurgitation has been present for over ten years, with consistent findings since a 2015 echocardiogram by review of report. AI likely mild mod and overall stable.   He has atrial fibrillation with four prior ablations. Currently, he experiences no palpitations, and his heart rate remains within normal limits.  Cholesterol levels in February were well-managed except for triglycerides at 230 last year (suspect nonfasting), now 123 mg/dL in feb [id number]. He has made dietary improvements. Hemoglobin A1c was 6.0%, indicating prediabetes but showing improvement.  He recently had labs drawn at [facility] for work, and plans to send the results to his healthcare provider. He uses MyChart for communication.    ROS: negative except per HPI above.  Studies Reviewed: Kent   EKG Interpretation Date/Time:  Monday March 16 2024 08:18:36 EDT Ventricular Rate:  68 PR Interval:  166 QRS Duration:  104 QT Interval:  410 QTC Calculation: 435 R Axis:   68  Text Interpretation: Sinus rhythm with marked sinus arrhythmia When compared with ECG of  20-Jun-2023 07:59, No significant change was found Confirmed by Merck Soyla ([id number]) on 03/16/2024 8:40:03 AM    Results LABS Cholesterol: Total cholesterol within normal limits, Triglycerides 123 mg/dL, HDL 64 mg/dL, LDL slightly elevated (09/2023) Hemoglobin A1c: 6% (09/2023)  DIAGNOSTIC Echocardiogram: Mild to moderate aortic valve regurgitation, very mildly thickened ventricular walls EKG: Normal sinus rhythm with sinus arrhythmia (03/16/2024) Risk Assessment/Calculations:    CHA2DS2-VASc Score = 2   This indicates a 2.2% annual risk of stroke. The patient's score is based upon: CHF History: 0 HTN History: 1 Diabetes History: 0 Stroke History: 0 Vascular Disease History: 0 Age Score: 1 Gender Score: 0      Physical Exam:   VS:  BP (!) 140/62 (BP Location: Right Arm, Patient Position: Sitting, Cuff Size: Normal)   Pulse 68   Ht 6' (1.829 m)   Wt 200 lb 12.8 oz (91.1 kg)   SpO2 95%   BMI 27.23 kg/m    Wt Readings from Last 3 Encounters:  03/16/24 200 lb 12.8 oz (91.1 kg)  06/20/23 198 lb 6.4 oz (90 kg)  12/20/22 197 lb (89.4 kg)     Physical Exam VITALS: BP- 140/62 GENERAL: Alert, cooperative, well developed, no acute distress HEENT: Normocephalic, normal oropharynx, moist mucous membranes CHEST: Clear to auscultation bilaterally, no wheezes, rhonchi, or crackles CARDIOVASCULAR: 2/6 diastolic murmur heard with end expiration, aortic valve regurgitation ABDOMEN: Soft, non-tender, non-distended, without organomegaly, normal bowel sounds EXTREMITIES: No cyanosis or edema NEUROLOGICAL: Cranial nerves grossly intact, moves all extremities without gross motor or sensory deficit   ASSESSMENT AND PLAN: .  Assessment and Plan Assessment & Plan Aortic valve regurgitation Mild to moderate, likely stable for ten years. Blood pressure management crucial.  - Order echocardiogram in April 2026 to assess aortic valve regurgitation. - Consider cardiac MRI or TEE if  regurgitation worsens. - Monitor blood pressure closely.  Hypertension in the setting of aortic valve regurgitation Blood pressure varies, with morning readings 140-150 mmHg, decreasing to low 130s. Current medication is amlodipine  5 mg daily. Side effects with increased doses. Spironolactone can be considered if control inadequate or echo shows worsening of LVH. - Continue amlodipine  5 mg daily. - Monitor blood pressure regularly, especially if readings are consistently in the 140s-150s. - Consider adding spironolactone if blood pressure control is inadequate or if echo shows worsening of valve leakage.  Atrial fibrillation, status post four ablations No current palpitations or symptoms. EKG shows normal sinus rhythm with sinus arrhythmia. - not on AC post ablation due to maintenance of SR.  Hyperlipidemia Cholesterol levels well managed, LDL could be slightly lower. Awaiting recent lab results for further assessment. - Send recent cholesterol labs via MyChart for review. - Continue current management as lipid profile is satisfactory. - continue atorvastatin  40 mg daily  Prediabetes Hemoglobin A1c is 6.0%, indicating prediabetes. Improvement from previous levels.      Soyla Merck, MD, FACC

## 2024-03-27 ENCOUNTER — Ambulatory Visit: Payer: Self-pay | Admitting: Internal Medicine

## 2024-04-20 ENCOUNTER — Other Ambulatory Visit: Payer: Self-pay | Admitting: Internal Medicine

## 2024-06-15 ENCOUNTER — Other Ambulatory Visit: Payer: Self-pay | Admitting: Internal Medicine

## 2024-06-17 MED ORDER — AMLODIPINE BESYLATE 5 MG PO TABS: 5.0000 mg | ORAL_TABLET | Freq: Every day | ORAL | 3 refills | Status: AC

## 2024-11-11 ENCOUNTER — Other Ambulatory Visit (HOSPITAL_COMMUNITY)

## 2024-11-27 ENCOUNTER — Ambulatory Visit: Admitting: Internal Medicine
